# Patient Record
Sex: Male | Born: 1960 | Race: White | Hispanic: No | Smoking: Former smoker
Health system: Southern US, Community
[De-identification: ages and names within clinical notes are randomized; demographics above are authoritative.]

## PROBLEM LIST (undated history)

## (undated) DIAGNOSIS — R931 Abnormal findings on diagnostic imaging of heart and coronary circulation: Secondary | ICD-10-CM

## (undated) DIAGNOSIS — U071 COVID-19: Secondary | ICD-10-CM

## (undated) DIAGNOSIS — R911 Solitary pulmonary nodule: Secondary | ICD-10-CM

## (undated) DIAGNOSIS — Z87891 Personal history of nicotine dependence: Secondary | ICD-10-CM

## (undated) DIAGNOSIS — I2510 Atherosclerotic heart disease of native coronary artery without angina pectoris: Secondary | ICD-10-CM

## (undated) DIAGNOSIS — I700 Atherosclerosis of aorta: Secondary | ICD-10-CM

## (undated) DIAGNOSIS — E785 Hyperlipidemia, unspecified: Secondary | ICD-10-CM

## (undated) DIAGNOSIS — T7840XA Allergy, unspecified, initial encounter: Secondary | ICD-10-CM

## (undated) DIAGNOSIS — Z8249 Family history of ischemic heart disease and other diseases of the circulatory system: Secondary | ICD-10-CM

## (undated) DIAGNOSIS — M7700 Medial epicondylitis, unspecified elbow: Secondary | ICD-10-CM

## (undated) DIAGNOSIS — I10 Essential (primary) hypertension: Secondary | ICD-10-CM

## (undated) HISTORY — DX: Allergy, unspecified, initial encounter: T78.40XA

## (undated) HISTORY — DX: Atherosclerotic heart disease of native coronary artery without angina pectoris: I25.10

## (undated) HISTORY — DX: Family history of ischemic heart disease and other diseases of the circulatory system: Z82.49

## (undated) HISTORY — DX: Solitary pulmonary nodule: R91.1

## (undated) HISTORY — PX: CORNEAL TRANSPLANT: SHX108

## (undated) HISTORY — DX: Hyperlipidemia, unspecified: E78.5

## (undated) HISTORY — DX: Medial epicondylitis, unspecified elbow: M77.00

## (undated) HISTORY — DX: Abnormal findings on diagnostic imaging of heart and coronary circulation: R93.1

## (undated) HISTORY — DX: Personal history of nicotine dependence: Z87.891

## (undated) HISTORY — PX: APPENDECTOMY: SHX54

## (undated) HISTORY — DX: COVID-19: U07.1

## (undated) HISTORY — DX: Essential (primary) hypertension: I10

## (undated) HISTORY — DX: Atherosclerosis of aorta: I70.0

## (undated) HISTORY — PX: FINGER FRACTURE SURGERY: SHX638

---

## 2004-05-01 ENCOUNTER — Emergency Department (HOSPITAL_COMMUNITY): Admission: EM | Admit: 2004-05-01 | Discharge: 2004-05-01 | Payer: Self-pay | Admitting: Emergency Medicine

## 2004-07-26 ENCOUNTER — Ambulatory Visit (HOSPITAL_BASED_OUTPATIENT_CLINIC_OR_DEPARTMENT_OTHER): Admission: RE | Admit: 2004-07-26 | Discharge: 2004-07-26 | Payer: Self-pay | Admitting: *Deleted

## 2004-07-26 ENCOUNTER — Ambulatory Visit (HOSPITAL_COMMUNITY): Admission: RE | Admit: 2004-07-26 | Discharge: 2004-07-26 | Payer: Self-pay | Admitting: *Deleted

## 2005-08-17 ENCOUNTER — Emergency Department (HOSPITAL_COMMUNITY): Admission: EM | Admit: 2005-08-17 | Discharge: 2005-08-17 | Payer: Self-pay | Admitting: Emergency Medicine

## 2006-02-16 ENCOUNTER — Emergency Department (HOSPITAL_COMMUNITY): Admission: EM | Admit: 2006-02-16 | Discharge: 2006-02-16 | Payer: Self-pay | Admitting: Emergency Medicine

## 2006-04-23 ENCOUNTER — Ambulatory Visit (HOSPITAL_BASED_OUTPATIENT_CLINIC_OR_DEPARTMENT_OTHER): Admission: RE | Admit: 2006-04-23 | Discharge: 2006-04-23 | Payer: Self-pay | Admitting: Urology

## 2006-04-23 ENCOUNTER — Encounter (INDEPENDENT_AMBULATORY_CARE_PROVIDER_SITE_OTHER): Payer: Self-pay | Admitting: *Deleted

## 2006-04-23 ENCOUNTER — Encounter (INDEPENDENT_AMBULATORY_CARE_PROVIDER_SITE_OTHER): Payer: Self-pay | Admitting: Specialist

## 2010-03-12 ENCOUNTER — Emergency Department (HOSPITAL_COMMUNITY): Admission: EM | Admit: 2010-03-12 | Discharge: 2010-03-12 | Payer: Self-pay | Admitting: Emergency Medicine

## 2011-01-26 NOTE — Op Note (Signed)
NAMEHILLEL, Jorge Gibson               ACCOUNT NO.:  [id number]   MEDICAL RECORD NO.:  [id number]          PATIENT TYPE:  AMB   LOCATION:  DSC                          FACILITY:  [facility]   PHYSICIAN:  Tennis Must Meyerdierks, M.D.DATE OF BIRTH:  Jan 18, 1961   DATE OF PROCEDURE:  07/26/2004  DATE OF DISCHARGE:                                 OPERATIVE REPORT   PREOPERATIVE DIAGNOSIS:  Fracture middle phalanx right middle finger.   POSTOPERATIVE DIAGNOSIS:  Fracture middle phalanx right middle finger.   PROCEDURE:  Open reduction and internal fixation middle phalanx right middle  finger.   SURGEON:  Lowell Bouton, M.D.   ANESTHESIA:  General.   OPERATIVE FINDINGS:  The patient had an oblique fracture of the middle  phalanx that was partially healed in the right middle finger.   PROCEDURE:  Under general anesthesia with a tourniquet on the right arm, the  right hand was prepped and draped in the usual fashion.  After  exsanguinating the limb, the tourniquet was inflated to 250 mmHg.  A  longitudinal incision was made over the dorsum of the middle phalanx of the  right middle finger. Sharp dissection was carried through the subcutaneous  tissues and bleeding points were coagulated.  Sharp dissection was carried  longitudinally through the extensor mechanism and the periosteum down to the  bone.  The fracture site was identified with difficulty and an elevator was  used to free up the fracture.  A Freer elevator was used once the fracture  was identified and the fracture site was taken down.  A rongeur was used to  remove any callus that had formed.  After completely freeing up the  fracture, it was reduced and a 4-5 K-wire was placed longitudinally across  the DIP joint holding the middle phalanx fracture in place.  A 3-5 K-wire  was then placed from dorsal to volar across the fracture holding it  temporarily.  X-ray showed good alignment and clinically, the finger was  straight.  The longitudinal K-wire was then removed and the mini-fragment  set using 1.5 mm screws was used to fix the fracture.  A 1.1 drill bit was  used to drill from dorsal to palmar and an 8 mm screw was inserted.  The 3-5  K-wire was then removed and that hole was drilled with a 1.1 mm drill.  An 8  mm screw was then inserted.  X-rays showed good position of the fracture and  there was stability clinically.  The wound was then copiously irrigated with  saline.  The periosteum was closed with 4-0 Vicryl and the extensor  mechanism was repaired with 4-0 Vicryl.  3-0 Prolene subcuticular suture was  used for the skin.  Steri-Strips were applied.  0.5% Marcaine digital block  was performed for pain control.  The patient was placed in a splint with a  dry, sterile dressing.  The tourniquet was released with good circulation of  the hand.  He went to the recovery room awake, stable, in good condition.      EMM/MEDQ  D:  07/26/2004  T:  07/26/2004  Job:  [id number]

## 2011-01-26 NOTE — Op Note (Signed)
NAMEKRISTOF, NADEEM               ACCOUNT NO.:  [id number]   MEDICAL RECORD NO.:  [id number]          PATIENT TYPE:  AMB   LOCATION:  [facility]                         FACILITY:  [facility]   PHYSICIAN:  Martina Sinner, MD DATE OF BIRTH:  06/24/1961   DATE OF PROCEDURE:  04/23/2006  DATE OF DISCHARGE:                                 OPERATIVE REPORT   PREOPERATIVE DIAGNOSIS:  Bladder tumor.   POSTOPERATIVE DIAGNOSIS:  Bladder tumor; mild meatal stenosis.   SURGERY:  Cystoscopy, dilation of urethral of distal urethra and meatus plus  transurethral resection of bladder tumor.   HISTORY:  Mr. Schetter was found to have a small polyp in his bladder on the  left lateral wall when being investigated for microscopic hematuria.  He has  a smoking history.   PROCEDURE NOTE:  The patient was prepped and draped in usual fashion.  I  initially cystoscoped the patient with 21-French Storz ureteroscope.  The  penile bulbar membranes and prostatic urethra were normal.  I took a good  look inside his bladder with a 13 and 70 degrees lens and had a small  papillary tumor on a stalk 1 cm lateral to the left ureteral orifice.  There  was no cystoscopic evidence of carcinoma in situ.  I did a saline bladder  wash and sent it for cytology.   I was not able to insert a 24-French resectoscope because of its bluntness  relative to his mild meatal stenosis.  He was dilated up to 28-French.  The  resectoscope was easily passed under direct cystoscopic guidance in the  bladder.   With the bladder partially full, I resected the small tumor on a stock.  I  took a deep muscle bite for staging.  Cystoscopically, the tumor was  superficial with no invasion.   The base of the resected biopsy was fulgurated.  I gave IV indigo carmine,  and there was efflux of indigo carmine from both ureteral orifices.  The  resection was away from the ureter, but I want to make certain that it was  not involved with the  resection.   Hemostasis was excellent.  The patient's bladder was emptied, and the  patient was sent to recovery room without a Foley catheter.           ______________________________  Martina Sinner, MD  Electronically Signed     SAM/MEDQ  D:  04/23/2006  T:  04/23/2006  Job:  [id number]

## 2015-12-07 ENCOUNTER — Encounter (HOSPITAL_COMMUNITY): Payer: Self-pay | Admitting: *Deleted

## 2015-12-07 ENCOUNTER — Emergency Department (HOSPITAL_COMMUNITY)
Admission: EM | Admit: 2015-12-07 | Discharge: 2015-12-07 | Disposition: A | Discharge status: Home / Self Care | Payer: BLUE CROSS/BLUE SHIELD | Source: Home / Self Care | Attending: Family Medicine | Admitting: Family Medicine

## 2015-12-07 DIAGNOSIS — J111 Influenza due to unidentified influenza virus with other respiratory manifestations: Secondary | ICD-10-CM

## 2015-12-07 DIAGNOSIS — R69 Illness, unspecified: Principal | ICD-10-CM

## 2015-12-07 NOTE — ED Notes (Signed)
Pt reports      Symptoms  Of    Fever       Body  Aches      Chills  X  2  Days        pt  Repports  Some  Dizzy  And  Cough  As  Well      sorethroat  As  Well

## 2015-12-07 NOTE — Discharge Instructions (Signed)
Drink plenty of fluids as discussed, use tylenol or advil for fever and mucinex or delsym for cough. Return or see your doctor if further problems

## 2015-12-07 NOTE — ED Provider Notes (Signed)
CSN: [id number]     Arrival date & time 12/07/15  1325 History   First MD Initiated Contact with Patient 12/07/15 1529     Chief Complaint  Patient presents with  . Fever   (Consider location/radiation/quality/duration/timing/severity/associated sxs/prior Treatment) Patient is a 55 y.o. male presenting with flu symptoms. The history is provided by the patient.  Influenza Presenting symptoms: cough, fever, myalgias and rhinorrhea   Presenting symptoms: no shortness of breath, no sore throat and no vomiting   Severity:  Mild Onset quality:  Sudden Duration:  3 days Progression:  Unchanged Chronicity:  New Relieved by:  None tried Worsened by:  Nothing tried Ineffective treatments:  Rest Associated symptoms: nasal congestion     History reviewed. No pertinent past medical history. No past surgical history on file. History reviewed. No pertinent family history. Social History  Substance Use Topics  . Smoking status: Never Smoker   . Smokeless tobacco: None  . Alcohol Use: No    Review of Systems  Constitutional: Positive for fever and appetite change.  HENT: Positive for congestion and rhinorrhea. Negative for sore throat.   Respiratory: Positive for cough. Negative for shortness of breath and wheezing.   Gastrointestinal: Negative.  Negative for vomiting.  Genitourinary: Negative.   Musculoskeletal: Positive for myalgias.  All other systems reviewed and are negative.   Allergies  Review of patient's allergies indicates not on file.  Home Medications   Prior to Admission medications   Not on File   Meds Ordered and Administered this Visit  Medications - No data to display  BP 151/77 mmHg  Pulse 82  Temp(Src) 100.2 F (37.9 C) (Oral)  Resp 14  SpO2 100% No data found.   Physical Exam  Constitutional: He is oriented to person, place, and time. He appears well-developed and well-nourished. No distress.  HENT:  Right Ear: External ear normal.  Left Ear:  External ear normal.  Mouth/Throat: Oropharynx is clear and moist.  Neck: Normal range of motion. Neck supple.  Cardiovascular: Normal rate, normal heart sounds and intact distal pulses.   Pulmonary/Chest: Effort normal and breath sounds normal.  Abdominal: Soft. Bowel sounds are normal. There is no tenderness. There is no rebound and no guarding.  Neurological: He is alert and oriented to person, place, and time.  Skin: Skin is warm and dry.  Nursing note and vitals reviewed.   ED Course  Procedures (including critical care time)  Labs Review Labs Reviewed - No data to display  Imaging Review No results found.   Visual Acuity Review  Right Eye Distance:   Left Eye Distance:   Bilateral Distance:    Right Eye Near:   Left Eye Near:    Bilateral Near:         MDM   1. Influenza-like illness        Billy Fischer, MD 12/07/15 1650

## 2018-09-10 DIAGNOSIS — U071 COVID-19: Secondary | ICD-10-CM

## 2018-09-10 HISTORY — DX: COVID-19: U07.1

## 2019-08-14 ENCOUNTER — Other Ambulatory Visit: Payer: Self-pay

## 2019-08-14 ENCOUNTER — Inpatient Hospital Stay (HOSPITAL_COMMUNITY)
Admission: EM | Admit: 2019-08-14 | Discharge: 2019-08-19 | DRG: 177 | Disposition: A | Discharge status: Home / Self Care | Payer: BC Managed Care – PPO | Attending: Internal Medicine | Admitting: Internal Medicine

## 2019-08-14 ENCOUNTER — Emergency Department (HOSPITAL_COMMUNITY): Payer: BC Managed Care – PPO

## 2019-08-14 ENCOUNTER — Encounter (HOSPITAL_COMMUNITY): Payer: Self-pay

## 2019-08-14 DIAGNOSIS — J1289 Other viral pneumonia: Secondary | ICD-10-CM | POA: Diagnosis present

## 2019-08-14 DIAGNOSIS — E871 Hypo-osmolality and hyponatremia: Secondary | ICD-10-CM | POA: Diagnosis present

## 2019-08-14 DIAGNOSIS — R0902 Hypoxemia: Secondary | ICD-10-CM | POA: Diagnosis present

## 2019-08-14 DIAGNOSIS — U071 COVID-19: Principal | ICD-10-CM | POA: Diagnosis present

## 2019-08-14 DIAGNOSIS — J189 Pneumonia, unspecified organism: Secondary | ICD-10-CM | POA: Diagnosis not present

## 2019-08-14 DIAGNOSIS — E663 Overweight: Secondary | ICD-10-CM | POA: Diagnosis present

## 2019-08-14 DIAGNOSIS — E860 Dehydration: Secondary | ICD-10-CM | POA: Diagnosis present

## 2019-08-14 DIAGNOSIS — R439 Unspecified disturbances of smell and taste: Secondary | ICD-10-CM | POA: Diagnosis present

## 2019-08-14 DIAGNOSIS — Z20822 Contact with and (suspected) exposure to covid-19: Secondary | ICD-10-CM

## 2019-08-14 DIAGNOSIS — Z6827 Body mass index (BMI) 27.0-27.9, adult: Secondary | ICD-10-CM | POA: Diagnosis not present

## 2019-08-14 DIAGNOSIS — J1281 Pneumonia due to SARS-associated coronavirus: Secondary | ICD-10-CM | POA: Diagnosis not present

## 2019-08-14 DIAGNOSIS — R0602 Shortness of breath: Secondary | ICD-10-CM | POA: Diagnosis present

## 2019-08-14 LAB — COMPREHENSIVE METABOLIC PANEL
ALT: 26 U/L (ref 0–44)
AST: 22 U/L (ref 15–41)
Albumin: 3.8 g/dL (ref 3.5–5.0)
Alkaline Phosphatase: 63 U/L (ref 38–126)
Anion gap: 11 (ref 5–15)
BUN: 14 mg/dL (ref 6–20)
CO2: 26 mmol/L (ref 22–32)
Calcium: 8.2 mg/dL — ABNORMAL LOW (ref 8.9–10.3)
Chloride: 95 mmol/L — ABNORMAL LOW (ref 98–111)
Creatinine, Ser: 0.82 mg/dL (ref 0.61–1.24)
GFR calc Af Amer: >60 mL/min (ref >60)
GFR calc non Af Amer: >60 mL/min (ref >60)
Glucose, Bld: 101 mg/dL — ABNORMAL HIGH (ref 70–99)
Potassium: 3.6 mmol/L (ref 3.5–5.1)
Sodium: 132 mmol/L — ABNORMAL LOW (ref 135–145)
Total Bilirubin: 1.2 mg/dL (ref 0.3–1.2)
Total Protein: 7.1 g/dL (ref 6.5–8.1)

## 2019-08-14 LAB — CBC WITH DIFFERENTIAL/PLATELET
Abs Immature Granulocytes: 0.04 10*3/uL (ref 0.00–0.07)
Basophils Absolute: 0 10*3/uL (ref 0.0–0.1)
Basophils Relative: 1 %
Eosinophils Absolute: 0 10*3/uL (ref 0.0–0.5)
Eosinophils Relative: 0 %
HCT: 40 % (ref 39.0–52.0)
Hemoglobin: 13.6 g/dL (ref 13.0–17.0)
Immature Granulocytes: 1 %
Lymphocytes Relative: 24 %
Lymphs Abs: 1.2 10*3/uL (ref 0.7–4.0)
MCH: 30.4 pg (ref 26.0–34.0)
MCHC: 34 g/dL (ref 30.0–36.0)
MCV: 89.3 fL (ref 80.0–100.0)
Monocytes Absolute: 0.6 10*3/uL (ref 0.1–1.0)
Monocytes Relative: 11 %
Neutro Abs: 3.1 10*3/uL (ref 1.7–7.7)
Neutrophils Relative %: 63 %
Platelets: 152 10*3/uL (ref 150–400)
RBC: 4.48 MIL/uL (ref 4.22–5.81)
RDW: 13.1 % (ref 11.5–15.5)
WBC: 4.9 10*3/uL (ref 4.0–10.5)
nRBC: 0 % (ref 0.0–0.2)

## 2019-08-14 LAB — TROPONIN I (HIGH SENSITIVITY)
Troponin I (High Sensitivity): 5 ng/L (ref <18)
Troponin I (High Sensitivity): 3 ng/L (ref <18)

## 2019-08-14 LAB — LACTIC ACID, PLASMA
Lactic Acid, Venous: 0.9 mmol/L (ref 0.5–1.9)
Lactic Acid, Venous: 0.9 mmol/L (ref 0.5–1.9)

## 2019-08-14 MED ORDER — SODIUM CHLORIDE 0.9 % IV SOLN: 1.0000 g | INTRAVENOUS | Status: DC

## 2019-08-14 MED ORDER — ACETAMINOPHEN 500 MG PO TABS
1000.0000 mg | ORAL_TABLET | Freq: Once | ORAL | Status: AC
  Administered 2019-08-15: 1000 mg via ORAL
  Filled 2019-08-14: qty 2

## 2019-08-14 MED ORDER — ACETAMINOPHEN 500 MG PO TABS
1000.0000 mg | ORAL_TABLET | Freq: Once | ORAL | Status: AC
  Administered 2019-08-14: 18:00:00 1000 mg via ORAL
  Filled 2019-08-14: qty 2

## 2019-08-14 MED ORDER — SODIUM CHLORIDE 0.9 % IV SOLN
500.0000 mg | Freq: Once | INTRAVENOUS | Status: AC
  Administered 2019-08-14: 500 mg via INTRAVENOUS
  Filled 2019-08-14: qty 500

## 2019-08-14 MED ORDER — SODIUM CHLORIDE 0.9 % IV SOLN: 500.0000 mg | Freq: Every day | INTRAVENOUS | Status: DC

## 2019-08-14 MED ORDER — SODIUM CHLORIDE 0.9 % IV SOLN
1.0000 g | Freq: Once | INTRAVENOUS | Status: AC
  Administered 2019-08-14: 20:00:00 1 g via INTRAVENOUS
  Filled 2019-08-14: qty 10

## 2019-08-14 NOTE — H&P (Signed)
History and Physical    Jorge Gibson [id number] DOB: 03-Jan-1961 DOA: 08/14/2019  PCP:  I have personally briefly reviewed the data and note from the emergency department  Chief Complaint: Worsening shortness of breath and low-grade fever  HPI: Jorge Gibson is a 58 y.o. male with history of no major illnesses.  He has not seen a physician in several years.  He has been getting more short of breath minimal cough symptoms started 5 days ago he had increasing chest congestion low-grade fever and generalized weakness.  He has been exposed to Covid positive patients.  He is Covid test is pending.  A swab had earlier been taking and [facility] repeat swab from here is pending also.  He kept feeling worse and came to the emergency room ED Course: Reviewed patient has been given IV fluids and Zithromax he has been feeling somewhat better.  Review of Systems: As per HPI otherwise 10 point review of systems negative.   History reviewed. No pertinent past medical history. He denies hypertension diabetes any respiratory or GI issues` ` History reviewed. No pertinent surgical history.   reports that he has never smoked. He has never used smokeless tobacco. He reports current alcohol use. No history on file for drug.  No Known Allergies  No family history on file.  Prior to Admission medications   Medication Sig Start Date End Date Taking? Authorizing Provider  acetaminophen (TYLENOL) 325 MG tablet Take 650 mg by mouth every 6 (six) hours as needed for mild pain or headache.   Yes [provider]    Physical Exam: Vitals:   08/14/19 2000 08/14/19 2018 08/14/19 2030 08/14/19 2100  BP: 136/70  121/65 117/63  Pulse: 75  70 72  Resp: (!) 26  (!) 21 (!) 22  Temp:  99.1 F (37.3 C)    TempSrc:  Oral    SpO2: 95%  94% 94%  Weight:      Height:        Constitutional: NAD, calm, comfortable Vitals:   08/14/19 2000 08/14/19 2018 08/14/19 2030 08/14/19 2100  BP: 136/70   121/65 117/63  Pulse: 75  70 72  Resp: (!) 26  (!) 21 (!) 22  Temp:  99.1 F (37.3 C)    TempSrc:  Oral    SpO2: 95%  94% 94%  Weight:      Height:       Eyes: PERRLA.  Conjunctivae normal ENT: No exudate no tenderness.  Mucous membranes are dry. Skin: Turgor is poor Neck: Supple no thyromegaly no lymph nodes. No venous distention noted Chest:  Non tender Lungs: Diminished breath sounds in the lower lung fields no wheezing Rales or egophony elicited. Abdomen: Is nontender with good bowel sounds no significant tenderness or guarding noted, Heart: Regular rhythm.  No S3-4 or S3 noted no murmurs. Extremities: No edema cyanosis or clubbing. Pulses: 1+ bilateral pedal pulses. Neurologic: No focal motor or sensory deficits noted.  He is alert oriented insight and says all questions appropriately. Psychiatric: Appears to be calm.  No indicator of depression. Mood and affect are normal  (Labs on Admission: I have personally reviewed following labs and imaging studies: WBC is normal hemoglobin is normal.  Sodium is somewhat low at 132 blood sugar is marginal at 101.  Kidney and liver function are preserved.  Calcium is 8 point  CBC: Recent Labs  Lab 08/14/19 1818  WBC 4.9  NEUTROABS 3.1  HGB 13.6  HCT 40.0  MCV  89.3  PLT 0000000   Basic Metabolic Panel: Recent Labs  Lab 08/14/19 1818  NA 132*  K 3.6  CL 95*  CO2 26  GLUCOSE 101*  BUN 14  CREATININE 0.82  CALCIUM 8.2*   GFR: Estimated Creatinine Clearance: 111.8 mL/min (by C-G formula based on SCr of 0.82 mg/dL). Liver Function Tests: Recent Labs  Lab 08/14/19 1818  AST 22  ALT 26  ALKPHOS 63  BILITOT 1.2  PROT 7.1  ALBUMIN 3.8   No results for input(s): LIPASE, AMYLASE in the last 168 hours. No results for input(s): AMMONIA in the last 168 hours. Coagulation Profile: No results for input(s): INR, PROTIME in the last 168 hours. Cardiac Enzymes: No results for input(s): CKTOTAL, CKMB, CKMBINDEX, TROPONINI in the  last 168 hours. BNP (last 3 results) No results for input(s): PROBNP in the last 8760 hours. HbA1C: No results for input(s): HGBA1C in the last 72 hours. CBG: No results for input(s): GLUCAP in the last 168 hours. Lipid Profile: No results for input(s): CHOL, HDL, LDLCALC, TRIG, CHOLHDL, LDLDIRECT in the last 72 hours. Thyroid Function Tests: No results for input(s): TSH, T4TOTAL, FREET4, T3FREE, THYROIDAB in the last 72 hours. Anemia Panel: No results for input(s): VITAMINB12, FOLATE, FERRITIN, TIBC, IRON, RETICCTPCT in the last 72 hours. Urine analysis: No results found for: COLORURINE, APPEARANCEUR, LABSPEC, [place], GLUCOSEU, HGBUR, BILIRUBINUR, KETONESUR, PROTEINUR, UROBILINOGEN, NITRITE, LEUKOCYTESUR  Radiological Exams on Admission: Dg Chest Port 1 View  Result Date: 08/14/2019 CLINICAL DATA:  Cough and fever. EXAM: PORTABLE CHEST 1 VIEW COMPARISON:  None FINDINGS: Normal heart size. No pleural effusion identified. Asymmetric airspace consolidation in the left midlung and left base identified compatible with pneumonia. Right lung clear. Visualized osseous structures appear intact. IMPRESSION: 1. Left midlung and left lower lobe pneumonia. 2. Recommend followup imaging to ensure resolution. Electronically Signed   By: Kerby Moors M.D.   On: 08/14/2019 19:29    EKG: Independently reviewed.   Assessment/Plan #1 person under investigation.  Covid screening pending/will treat as infected till proven otherwise #2 left lower lobe pneumonia: [place] community-acquired/treat with Zithromax dose has been given.  Add Rocephin to the regimen #3 hyponatremia with mild clinical dehydration/hydration and follow kidney and liver function   DVT prophylaxis: Lovenox Code Status: Full Family Communication: No family available Disposition Plan: Home self-care Consults called: None Admission status: Full  Time spent: 55 minutes  Pearlean Brownie MD [facility] Pager [phone]   If  7PM-7AM, please contact night-coverage [email or web address] Password [facility]  08/14/2019, 9:36 PM

## 2019-08-14 NOTE — ED Triage Notes (Signed)
Pt states starting Thursday he has experienced SHOB, fever, productive cough, headache, loss of taste and smell, chest pressure and chills. Pt was tested today for COVID at [facility] awaiting results.

## 2019-08-14 NOTE — ED Provider Notes (Signed)
[facility] DEPT Provider Note   CSN: [id number] Arrival date & time: 08/14/19  1655     History   Chief Complaint Chief Complaint  Patient presents with  . Shortness of Breath  . Chest Pain  . Cough    HPI DINK TILTON is a 58 y.o. male.     58 year old male with prior medical history as detailed below presents for evaluation of cough, chest congestion, fever, and weakness.  Patient reports symptoms started approximately 5 days prior.  He reports fevers at home.  He denies known exposure to Covid positive patients.  He reportedly obtain an outpatient Covid test earlier today at [facility]  He felt worse this evening and decided to come to the ED for evaluation.  He does not have routine primary medical care.  The history is provided by the patient and medical records.  Shortness of Breath Severity:  Mild Onset quality:  Gradual Duration:  5 days Timing:  Constant Progression:  Worsening Chronicity:  New Relieved by:  Nothing Worsened by:  Nothing Associated symptoms: chest pain, cough and fever   Chest Pain Associated symptoms: cough, fever and shortness of breath   Cough Associated symptoms: chest pain, fever and shortness of breath     History reviewed. No pertinent past medical history.  There are no active problems to display for this patient.   History reviewed. No pertinent surgical history.      Home Medications    Prior to Admission medications   Not on File    Family History No family history on file.  Social History Social History   Tobacco Use  . Smoking status: Never Smoker  . Smokeless tobacco: Never Used  Substance Use Topics  . Alcohol use: Yes    Comment: social  . Drug use: Not on file     Allergies   Patient has no known allergies.   Review of Systems Review of Systems  Constitutional: Positive for fever.  Respiratory: Positive for cough and shortness of breath.   Cardiovascular:  Positive for chest pain.  All other systems reviewed and are negative.    Physical Exam Updated Vital Signs BP 118/62   Pulse 73   Temp 100.2 F (37.9 C) (Oral)   Resp (!) 22   Ht 5\' 11"  (1.803 m)   Wt 90.7 kg   SpO2 92%   BMI 27.89 kg/m   Physical Exam Vitals signs and nursing note reviewed.  Constitutional:      General: He is not in acute distress.    Appearance: He is well-developed.  HENT:     Head: Normocephalic and atraumatic.  Eyes:     Conjunctiva/sclera: Conjunctivae normal.     Pupils: Pupils are equal, round, and reactive to light.  Neck:     Musculoskeletal: Normal range of motion and neck supple.  Cardiovascular:     Rate and Rhythm: Normal rate and regular rhythm.     Heart sounds: Normal heart sounds.  Pulmonary:     Effort: Pulmonary effort is normal. No respiratory distress.     Breath sounds: Normal breath sounds.  Abdominal:     General: There is no distension.     Palpations: Abdomen is soft.     Tenderness: There is no abdominal tenderness.  Musculoskeletal: Normal range of motion.        General: No deformity.  Skin:    General: Skin is warm and dry.  Neurological:     Mental  Status: He is alert and oriented to person, place, and time.      ED Treatments / Results  Labs (all labs ordered are listed, but only abnormal results are displayed) Labs Reviewed  COMPREHENSIVE METABOLIC PANEL - Abnormal; Notable for the following components:      Result Value   Sodium 132 (*)    Chloride 95 (*)    Glucose, Bld 101 (*)    Calcium 8.2 (*)    All other components within normal limits  SARS CORONAVIRUS 2 (TAT 6-24 HRS)  CULTURE, BLOOD (ROUTINE X 2)  CULTURE, BLOOD (ROUTINE X 2)  CBC WITH DIFFERENTIAL/PLATELET  LACTIC ACID, PLASMA  LACTIC ACID, PLASMA  TROPONIN I (HIGH SENSITIVITY)  TROPONIN I (HIGH SENSITIVITY)    EKG EKG Interpretation  Date/Time:  Friday August 14 2019 17:37:49 EST Ventricular Rate:  82 PR Interval:    QRS  Duration: 104 QT Interval:  367 QTC Calculation: 429 R Axis:   -9 Text Interpretation: Sinus rhythm Confirmed by Dene Gentry [phone]) on 08/14/2019 5:55:05 PM   Radiology Dg Chest Port 1 View  Result Date: 08/14/2019 CLINICAL DATA:  Cough and fever. EXAM: PORTABLE CHEST 1 VIEW COMPARISON:  None FINDINGS: Normal heart size. No pleural effusion identified. Asymmetric airspace consolidation in the left midlung and left base identified compatible with pneumonia. Right lung clear. Visualized osseous structures appear intact. IMPRESSION: 1. Left midlung and left lower lobe pneumonia. 2. Recommend followup imaging to ensure resolution. Electronically Signed   By: Kerby Moors M.D.   On: 08/14/2019 19:29    Procedures Procedures (including critical care time)  Medications Ordered in ED Medications  cefTRIAXone (ROCEPHIN) 1 g in sodium chloride 0.9 % 100 mL IVPB (has no administration in time range)  azithromycin (ZITHROMAX) 500 mg in sodium chloride 0.9 % 250 mL IVPB (has no administration in time range)  acetaminophen (TYLENOL) tablet 1,000 mg (1,000 mg Oral Given 08/14/19 1803)     Initial Impression / Assessment and Plan / ED Course  I have reviewed the triage vital signs and the nursing notes.  Pertinent labs & imaging results that were available during my care of the patient were reviewed by me and considered in my medical decision making (see chart for details).        MDM  Screen complete  LEVESTER RISTIC was evaluated in Emergency Department on 08/14/2019 for the symptoms described in the history of present illness. He was evaluated in the context of the global COVID-19 pandemic, which necessitated consideration that the patient might be at risk for infection with the SARS-CoV-2 virus that causes COVID-19. Institutional protocols and algorithms that pertain to the evaluation of patients at risk for COVID-19 are in a state of rapid change based on information released by  regulatory bodies including the CDC and federal and state organizations. These policies and algorithms were followed during the patient's care in the ED.  Patient is presenting for evaluation of symptoms suggestive of pulmonary infection.  Chest x-ray suggest left mid and lower lung infiltrate.   Patient will be treated for community-acquired pneumonia.  Covid test is pending at the time of admission.  Hospitalist service is aware of case and will evaluate for admission.  Final Clinical Impressions(s) / ED Diagnoses   Final diagnoses:  Community acquired pneumonia of left lower lobe of lung    ED Discharge Orders    None       Valarie Merino, MD 08/14/19 2004

## 2019-08-15 DIAGNOSIS — J1281 Pneumonia due to SARS-associated coronavirus: Secondary | ICD-10-CM

## 2019-08-15 DIAGNOSIS — E663 Overweight: Secondary | ICD-10-CM

## 2019-08-15 LAB — CBC
HCT: 38.6 % — ABNORMAL LOW (ref 39.0–52.0)
Hemoglobin: 12.9 g/dL — ABNORMAL LOW (ref 13.0–17.0)
MCH: 29.7 pg (ref 26.0–34.0)
MCHC: 33.4 g/dL (ref 30.0–36.0)
MCV: 88.9 fL (ref 80.0–100.0)
Platelets: 151 10*3/uL (ref 150–400)
RBC: 4.34 MIL/uL (ref 4.22–5.81)
RDW: 13.1 % (ref 11.5–15.5)
WBC: 4.5 10*3/uL (ref 4.0–10.5)
nRBC: 0 % (ref 0.0–0.2)

## 2019-08-15 LAB — HIV ANTIBODY (ROUTINE TESTING W REFLEX): HIV Screen 4th Generation wRfx: NONREACTIVE

## 2019-08-15 LAB — C-REACTIVE PROTEIN: CRP: 10 mg/dL — ABNORMAL HIGH (ref <1.0)

## 2019-08-15 LAB — COMPREHENSIVE METABOLIC PANEL
ALT: 22 U/L (ref 0–44)
AST: 21 U/L (ref 15–41)
Albumin: 3.3 g/dL — ABNORMAL LOW (ref 3.5–5.0)
Alkaline Phosphatase: 62 U/L (ref 38–126)
Anion gap: 12 (ref 5–15)
BUN: 15 mg/dL (ref 6–20)
CO2: 25 mmol/L (ref 22–32)
Calcium: 8.3 mg/dL — ABNORMAL LOW (ref 8.9–10.3)
Chloride: 99 mmol/L (ref 98–111)
Creatinine, Ser: 0.65 mg/dL (ref 0.61–1.24)
GFR calc Af Amer: >60 mL/min (ref >60)
GFR calc non Af Amer: >60 mL/min (ref >60)
Glucose, Bld: 103 mg/dL — ABNORMAL HIGH (ref 70–99)
Potassium: 3.5 mmol/L (ref 3.5–5.1)
Sodium: 136 mmol/L (ref 135–145)
Total Bilirubin: 0.8 mg/dL (ref 0.3–1.2)
Total Protein: 6.7 g/dL (ref 6.5–8.1)

## 2019-08-15 LAB — SARS CORONAVIRUS 2 (TAT 6-24 HRS): SARS Coronavirus 2: POSITIVE — AB

## 2019-08-15 LAB — GLUCOSE, CAPILLARY
Glucose-Capillary: 160 mg/dL — ABNORMAL HIGH (ref 70–99)
Glucose-Capillary: 115 mg/dL — ABNORMAL HIGH (ref 70–99)

## 2019-08-15 LAB — CREATININE, SERUM
Creatinine, Ser: 0.74 mg/dL (ref 0.61–1.24)
GFR calc Af Amer: >60 mL/min (ref >60)
GFR calc non Af Amer: >60 mL/min (ref >60)

## 2019-08-15 LAB — D-DIMER, QUANTITATIVE: D-Dimer, Quant: 0.52 ug/mL-FEU — ABNORMAL HIGH (ref 0.00–0.50)

## 2019-08-15 LAB — NOVEL CORONAVIRUS, NAA: SARS-CoV-2, NAA: DETECTED — AB

## 2019-08-15 LAB — BRAIN NATRIURETIC PEPTIDE: B Natriuretic Peptide: 16.1 pg/mL (ref 0.0–100.0)

## 2019-08-15 LAB — TYPE AND SCREEN
ABO/RH(D): A POS
Antibody Screen: NEGATIVE

## 2019-08-15 MED ORDER — SODIUM CHLORIDE 0.9 % IV SOLN
100.0000 mg | Freq: Every day | INTRAVENOUS | Status: AC
  Administered 2019-08-16 – 2019-08-19 (×4): 100 mg via INTRAVENOUS
  Filled 2019-08-15 (×3): qty 20
  Filled 2019-08-15 (×2): qty 100

## 2019-08-15 MED ORDER — FOLIC ACID 1 MG PO TABS
1.0000 mg | ORAL_TABLET | Freq: Every day | ORAL | Status: DC
  Administered 2019-08-15 – 2019-08-19 (×5): 1 mg via ORAL
  Filled 2019-08-15 (×5): qty 1

## 2019-08-15 MED ORDER — INSULIN ASPART 100 UNIT/ML ~~LOC~~ SOLN
0.0000 [IU] | SUBCUTANEOUS | Status: DC
  Administered 2019-08-15: 3 [IU] via SUBCUTANEOUS

## 2019-08-15 MED ORDER — GUAIFENESIN-DM 100-10 MG/5ML PO SYRP
10.0000 mL | ORAL_SOLUTION | ORAL | Status: DC | PRN
  Administered 2019-08-15 – 2019-08-17 (×5): 10 mL via ORAL
  Filled 2019-08-15 (×5): qty 10

## 2019-08-15 MED ORDER — VITAMIN B-1 100 MG PO TABS
100.0000 mg | ORAL_TABLET | Freq: Every day | ORAL | Status: DC
  Administered 2019-08-15 – 2019-08-19 (×5): 100 mg via ORAL
  Filled 2019-08-15 (×5): qty 1

## 2019-08-15 MED ORDER — ENOXAPARIN SODIUM 40 MG/0.4ML ~~LOC~~ SOLN
40.0000 mg | Freq: Every day | SUBCUTANEOUS | Status: DC
  Administered 2019-08-15: 40 mg via SUBCUTANEOUS
  Filled 2019-08-15: qty 0.4

## 2019-08-15 MED ORDER — SODIUM CHLORIDE 0.9 % IV SOLN
200.0000 mg | Freq: Once | INTRAVENOUS | Status: AC
  Administered 2019-08-15: 200 mg via INTRAVENOUS
  Filled 2019-08-15: qty 200

## 2019-08-15 MED ORDER — DEXAMETHASONE SODIUM PHOSPHATE 10 MG/ML IJ SOLN
6.0000 mg | INTRAMUSCULAR | Status: DC
  Administered 2019-08-15: 6 mg via INTRAVENOUS
  Filled 2019-08-15: qty 1

## 2019-08-15 MED ORDER — ALBUTEROL SULFATE HFA 108 (90 BASE) MCG/ACT IN AERS
2.0000 | INHALATION_SPRAY | Freq: Four times a day (QID) | RESPIRATORY_TRACT | Status: DC | PRN
  Administered 2019-08-18: 2 via RESPIRATORY_TRACT
  Filled 2019-08-15: qty 6.7

## 2019-08-15 MED ORDER — DEXAMETHASONE SODIUM PHOSPHATE 10 MG/ML IJ SOLN
6.0000 mg | Freq: Two times a day (BID) | INTRAMUSCULAR | Status: DC
  Administered 2019-08-15 – 2019-08-17 (×4): 6 mg via INTRAVENOUS
  Filled 2019-08-15 (×4): qty 1

## 2019-08-15 MED ORDER — SODIUM CHLORIDE 0.9 % IV SOLN: 1.0000 g | INTRAVENOUS | Status: DC

## 2019-08-15 NOTE — ED Notes (Signed)
[facility] arrived  

## 2019-08-15 NOTE — Plan of Care (Signed)
  Problem: Respiratory: °Goal: Will maintain a patent airway °Outcome: Progressing °  °Problem: Respiratory: °Goal: Complications related to the disease process, condition or treatment will be avoided or minimized °Outcome: Progressing °  °Problem: Education: °Goal: Knowledge of risk factors and measures for prevention of condition will improve °Outcome: Progressing °  °

## 2019-08-15 NOTE — ED Notes (Signed)
ED TO INPATIENT HANDOFF REPORT  Name/Age/Gender Jorge Gibson 58 y.o. male  Code Status    Code Status Orders  (From admission, onward)         Start     Ordered   08/15/19 0007  Full code  Continuous     08/15/19 0007        Code Status History    This patient has a current code status but no historical code status.   Advance Care Planning Activity      Home/SNF/Other Home  Chief Complaint chest pain; shob; congestion  Level of Care/Admitting Diagnosis ED Disposition    ED Disposition Condition [facility] Area: [place] [[id number]]  Level of Care: Med-Surg [16]  Covid Evaluation: Confirmed COVID Positive  Diagnosis: CAP (community acquired [id number]  Admitting Physician: Pearlean Brownie [id number]  Attending Physician: HAQ, AHMAD T [id number]  Estimated length of stay: past midnight tomorrow  Certification:: I certify this patient will need inpatient services for at least 2 midnights  PT Class (Do Not Modify): Inpatient [101]  PT Acc Code (Do Not Modify): Private [1]       Medical History History reviewed. No pertinent past medical history.  Allergies No Known Allergies  IV Location/Drains/Wounds Patient Lines/Drains/Airways Status   Active Line/Drains/Airways    Name:   Placement date:   Placement time:   Site:   Days:   Peripheral IV 08/14/19 Left Antecubital   08/14/19    2000    Antecubital   1          Labs/Imaging Results for orders placed or performed during the hospital encounter of 08/14/19 (from the past 48 hour(s))  Comprehensive metabolic panel     Status: Abnormal   Collection Time: 08/14/19  6:18 PM  Result Value Ref Range   Sodium 132 (L) 135 - 145 mmol/L   Potassium 3.6 3.5 - 5.1 mmol/L   Chloride 95 (L) 98 - 111 mmol/L   CO2 26 22 - 32 mmol/L   Glucose, Bld 101 (H) 70 - 99 mg/dL   BUN 14 6 - 20 mg/dL   Creatinine, Ser 0.82 0.61 - 1.24 mg/dL   Calcium 8.2 (L) 8.9 - 10.3 mg/dL    Total Protein 7.1 6.5 - 8.1 g/dL   Albumin 3.8 3.5 - 5.0 g/dL   AST 22 15 - 41 U/L   ALT 26 0 - 44 U/L   Alkaline Phosphatase 63 38 - 126 U/L   Total Bilirubin 1.2 0.3 - 1.2 mg/dL   GFR calc non Af Amer >60 >60 mL/min   GFR calc Af Amer >60 >60 mL/min   Anion gap 11 5 - 15    Comment: Performed at [facility], [place] [address] [place], [place] [address]  CBC with Differential     Status: None   Collection Time: 08/14/19  6:18 PM  Result Value Ref Range   WBC 4.9 4.0 - 10.5 K/uL   RBC 4.48 4.22 - 5.81 MIL/uL   Hemoglobin 13.6 13.0 - 17.0 g/dL   HCT 40.0 39.0 - 52.0 %   MCV 89.3 80.0 - 100.0 fL   MCH 30.4 26.0 - 34.0 pg   MCHC 34.0 30.0 - 36.0 g/dL   RDW 13.1 11.5 - 15.5 %   Platelets 152 150 - 400 K/uL   nRBC 0.0 0.0 - 0.2 %   Neutrophils Relative % 63 %   Neutro Abs 3.1 1.7 -  7.7 K/uL   Lymphocytes Relative 24 %   Lymphs Abs 1.2 0.7 - 4.0 K/uL   Monocytes Relative 11 %   Monocytes Absolute 0.6 0.1 - 1.0 K/uL   Eosinophils Relative 0 %   Eosinophils Absolute 0.0 0.0 - 0.5 K/uL   Basophils Relative 1 %   Basophils Absolute 0.0 0.0 - 0.1 K/uL   Immature Granulocytes 1 %   Abs Immature Granulocytes 0.04 0.00 - 0.07 K/uL    Comment: Performed at [facility], [place] [address], [place], [place] [address]  SARS CORONAVIRUS 2 (TAT 6-24 HRS) Nasopharyngeal Nasopharyngeal Swab     Status: Abnormal   Collection Time: 08/14/19  6:18 PM   Specimen: Nasopharyngeal Swab  Result Value Ref Range   SARS Coronavirus 2 POSITIVE (A) NEGATIVE    Comment: RESULT CALLED TO, READ BACK BY AND VERIFIED WITH: Haze Justin [id number] 08/15/2019 T. TYSOR (NOTE) SARS-CoV-2 target nucleic acids are DETECTED. The SARS-CoV-2 RNA is generally detectable in upper and lower respiratory specimens during the acute phase of infection. Positive results are indicative of the presence of SARS-CoV-2 RNA. Clinical correlation with patient history and other diagnostic information  is  necessary to determine patient infection status. Positive results do not rule out bacterial infection or co-infection with other viruses.  The expected result is Negative. Fact Sheet for Patients: SugarRoll.be Fact Sheet for Healthcare Providers: https://www.woods-mathews.com/ This test is not yet approved or cleared by the [place] FDA and  has been authorized for detection and/or diagnosis of SARS-CoV-2 by FDA under an Emergency Use Authorization (EUA). This EUA will remain  in effect (meaning this test can be used) f or the duration of the COVID-19 declaration under Section 564(b)(1) of the Act, 21 U.S.C. section 360bbb-3(b)(1), unless the authorization is terminated or revoked sooner. Performed at [facility], [place] [address], [place], [place] [address]   Troponin I (High Sensitivity)     Status: None   Collection Time: 08/14/19  6:18 PM  Result Value Ref Range   Troponin I (High Sensitivity) 5 <18 ng/L    Comment: (NOTE) Elevated high sensitivity troponin I (hsTnI) values and significant  changes across serial measurements may suggest ACS but many other  chronic and acute conditions are known to elevate hsTnI results.  Refer to the "Links" section for chest pain algorithms and additional  guidance. Performed at [facility], [place] [address], [place], [place] [address]   Lactic acid, plasma     Status: None   Collection Time: 08/14/19  6:18 PM  Result Value Ref Range   Lactic Acid, Venous 0.9 0.5 - 1.9 mmol/L    Comment: Performed at [facility], [place] [address] [place], [place] [address]  Lactic acid, plasma     Status: None   Collection Time: 08/14/19  7:52 PM  Result Value Ref Range   Lactic Acid, Venous 0.9 0.5 - 1.9 mmol/L    Comment: Performed at [facility], [place] [address] [place], [place] [address]  Troponin I (High Sensitivity)     Status: None    Collection Time: 08/14/19  7:52 PM  Result Value Ref Range   Troponin I (High Sensitivity) 3 <18 ng/L    Comment: (NOTE) Elevated high sensitivity troponin I (hsTnI) values and significant  changes across serial measurements may suggest ACS but many other  chronic and acute conditions are known to elevate hsTnI results.  Refer to the "Links" section for chest pain  algorithms and additional  guidance. Performed at [facility], [place] [address], [place], [place] [address]   CBC     Status: Abnormal   Collection Time: 08/15/19  2:42 AM  Result Value Ref Range   WBC 4.5 4.0 - 10.5 K/uL   RBC 4.34 4.22 - 5.81 MIL/uL   Hemoglobin 12.9 (L) 13.0 - 17.0 g/dL   HCT 38.6 (L) 39.0 - 52.0 %   MCV 88.9 80.0 - 100.0 fL   MCH 29.7 26.0 - 34.0 pg   MCHC 33.4 30.0 - 36.0 g/dL   RDW 13.1 11.5 - 15.5 %   Platelets 151 150 - 400 K/uL   nRBC 0.0 0.0 - 0.2 %    Comment: Performed at [facility], [place] [address] [place], [place] [address]  Creatinine, serum     Status: None   Collection Time: 08/15/19  2:42 AM  Result Value Ref Range   Creatinine, Ser 0.74 0.61 - 1.24 mg/dL   GFR calc non Af Amer >60 >60 mL/min   GFR calc Af Amer >60 >60 mL/min    Comment: Performed at [facility], [place] [address], [place], [place] [address]  Comprehensive metabolic panel     Status: Abnormal   Collection Time: 08/15/19  5:51 AM  Result Value Ref Range   Sodium 136 135 - 145 mmol/L   Potassium 3.5 3.5 - 5.1 mmol/L   Chloride 99 98 - 111 mmol/L   CO2 25 22 - 32 mmol/L   Glucose, Bld 103 (H) 70 - 99 mg/dL   BUN 15 6 - 20 mg/dL   Creatinine, Ser 0.65 0.61 - 1.24 mg/dL   Calcium 8.3 (L) 8.9 - 10.3 mg/dL   Total Protein 6.7 6.5 - 8.1 g/dL   Albumin 3.3 (L) 3.5 - 5.0 g/dL   AST 21 15 - 41 U/L   ALT 22 0 - 44 U/L   Alkaline Phosphatase 62 38 - 126 U/L   Total Bilirubin 0.8 0.3 - 1.2 mg/dL   GFR calc non Af Amer >60 >60 mL/min   GFR calc Af Amer >60 >60  mL/min   Anion gap 12 5 - 15    Comment: Performed at [facility], [place] [address], [place], [place] [address]   Dg Chest Port 1 View  Result Date: 08/14/2019 CLINICAL DATA:  Cough and fever. EXAM: PORTABLE CHEST 1 VIEW COMPARISON:  None FINDINGS: Normal heart size. No pleural effusion identified. Asymmetric airspace consolidation in the left midlung and left base identified compatible with pneumonia. Right lung clear. Visualized osseous structures appear intact. IMPRESSION: 1. Left midlung and left lower lobe pneumonia. 2. Recommend followup imaging to ensure resolution. Electronically Signed   By: Kerby Moors M.D.   On: 08/14/2019 19:29    Pending Labs Unresulted Labs (From admission, onward)    Start     Ordered   08/22/19 0000  Creatinine, serum  (enoxaparin (LOVENOX)    CrCl >/= 30 ml/min)  Weekly,   R    Comments: while on enoxaparin therapy    08/15/19 0007   08/16/19 0500  C-reactive protein  Daily,   R     08/15/19 1033   08/16/19 0500  Ferritin  Daily,   R     08/15/19 1033   08/16/19 0500  D-dimer, quantitative (not at [facility])  Daily,   R     08/15/19 1033   08/16/19 0500  Fibrinogen  Daily,   R  08/15/19 1033   08/16/19 0500  Procalcitonin - Baseline  Daily,   STAT     08/15/19 1033   08/16/19 0500  Comprehensive metabolic panel  Daily,   R     08/15/19 1039   08/15/19 0920  D-dimer, quantitative (not at [facility])  Once,   STAT     08/15/19 0919   08/15/19 0007  HIV Antibody (routine testing w rflx)  (HIV Antibody (Routine testing w reflex) panel)  Once,   STAT     08/15/19 0007   08/14/19 1752  Culture, blood (routine x 2)  BLOOD CULTURE X 2,   STAT     08/14/19 1752          Vitals/Pain Today's Vitals   08/15/19 0930 08/15/19 0945 08/15/19 1023 08/15/19 1030  BP: 127/67  131/65 131/66  Pulse: 76 77 82 71  Resp:   (!) 21   Temp:      TempSrc:      SpO2: 91% 94% 98% 97%  Weight:      Height:      PainSc:        Isolation  Precautions No active isolations  Medications Medications  enoxaparin (LOVENOX) injection 40 mg (40 mg Subcutaneous Given [id number])  folic acid (FOLVITE) tablet 1 mg (has no administration in time range)  thiamine (VITAMIN B-1) tablet 100 mg (has no administration in time range)  azithromycin (ZITHROMAX) 500 mg in sodium chloride 0.9 % 250 mL IVPB (has no administration in time range)  cefTRIAXone (ROCEPHIN) 1 g in sodium chloride 0.9 % 100 mL IVPB (has no administration in time range)  dexamethasone (DECADRON) injection 6 mg (has no administration in time range)  remdesivir 200 mg in sodium chloride 0.9% 250 mL IVPB (has no administration in time range)    Followed by  remdesivir 100 mg in sodium chloride 0.9 % 100 mL IVPB (has no administration in time range)  acetaminophen (TYLENOL) tablet 1,000 mg (1,000 mg Oral Given 08/14/19 1803)  cefTRIAXone (ROCEPHIN) 1 g in sodium chloride 0.9 % 100 mL IVPB (0 g Intravenous Stopped 08/14/19 2127)  azithromycin (ZITHROMAX) 500 mg in sodium chloride 0.9 % 250 mL IVPB (0 mg Intravenous Stopped 08/14/19 2323)  acetaminophen (TYLENOL) tablet 1,000 mg (1,000 mg Oral Given 08/15/19 0038)    Mobility walks

## 2019-08-15 NOTE — Progress Notes (Signed)
PROGRESS NOTE    ADAM RENTSCHLER  [id number] DOB: 1961/08/15 DOA: 08/14/2019 PCP: Patient, No Pcp Per  Outpatient Specialists:    Brief Narrative:  Patient is a 58 year old Caucasian male, overweight, with no documented past medical history.  Patient's problem started about 5 days prior to admission with worsening shortness of breath, low-grade fever, cough, chest congestion and weakness.  Patient tested positive for Covid.  Chest x-ray revealed left middle lobe and left lower lobe pneumonia.  Patient will be transferred to [place] for further assessment and management of pneumonia secondary to severe acute respiratory syndrome Cov-2.  Assessment & Plan:   Active Problems:   CAP (community acquired pneumonia)  Pneumonia secondary to severe acute respiratory syndrome Cov-2: -Transfer patient to [facility]. -IV Decadron. -Remdesivir. -Monitor inflammatory markers. -Supplemental oxygen. -Supportive care. -Further management depend on hospital course.  Overweight: -Further management on outpatient basis. -Diet and exercise.   DVT prophylaxis: Subcutaneous Lovenox Code Status: Full code Family Communication:  Disposition Plan: Transfer patient to [employer]:   None  Procedures:   None  Antimicrobials:   Remdesivir  Azithromycin  Rocephin   Subjective: Patient continues to have low grade fever.  On further questioning, patient endorsed loss of taste and smell, had diarrhea yesterday, coughing and shortness of breath.  Objective: Vitals:   08/15/19 0330 08/15/19 0400 08/15/19 0631 08/15/19 0700  BP: 119/60 115/63 112/63 108/66  Pulse: 67 66 70 65  Resp: 17 (!) 21 17   Temp:      TempSrc:      SpO2: 95% 95% 95% 95%  Weight:      Height:       No intake or output data in the 24 hours ending 08/15/19 0927 Filed Weights   08/14/19 1734  Weight: 90.7 kg    Examination:  General exam: Appears calm and  comfortable.  Patient is overweight. Respiratory system: Decreased air entry globally. Cardiovascular system: S1 & S2 heard, RRR. No JVD, murmurs, rubs, gallops or clicks. No pedal edema. Gastrointestinal system: Abdomen is obese, soft and nontender. No organomegaly or masses felt. Normal bowel sounds heard. Central nervous system: Alert and oriented.  Patient moves all extremities. Extremities: No leg edema.   Data Reviewed: I have personally reviewed following labs and imaging studies  CBC: Recent Labs  Lab 08/14/19 1818 08/15/19 0242  WBC 4.9 4.5  NEUTROABS 3.1  --   HGB 13.6 12.9*  HCT 40.0 38.6*  MCV 89.3 88.9  PLT 152 [id number]   Basic Metabolic Panel: Recent Labs  Lab 08/14/19 1818 08/15/19 0242 08/15/19 0551  NA 132*  --  136  K 3.6  --  3.5  CL 95*  --  99  CO2 26  --  25  GLUCOSE 101*  --  103*  BUN 14  --  15  CREATININE 0.82 0.74 0.65  CALCIUM 8.2*  --  8.3*   GFR: Estimated Creatinine Clearance: 114.6 mL/min (by C-G formula based on SCr of 0.65 mg/dL). Liver Function Tests: Recent Labs  Lab 08/14/19 1818 08/15/19 0551  AST 22 21  ALT 26 22  ALKPHOS 63 62  BILITOT 1.2 0.8  PROT 7.1 6.7  ALBUMIN 3.8 3.3*   No results for input(s): LIPASE, AMYLASE in the last 168 hours. No results for input(s): AMMONIA in the last 168 hours. Coagulation Profile: No results for input(s): INR, PROTIME in the last 168 hours. Cardiac Enzymes: No results for input(s): CKTOTAL, CKMB,  CKMBINDEX, TROPONINI in the last 168 hours. BNP (last 3 results) No results for input(s): PROBNP in the last 8760 hours. HbA1C: No results for input(s): HGBA1C in the last 72 hours. CBG: No results for input(s): GLUCAP in the last 168 hours. Lipid Profile: No results for input(s): CHOL, HDL, LDLCALC, TRIG, CHOLHDL, LDLDIRECT in the last 72 hours. Thyroid Function Tests: No results for input(s): TSH, T4TOTAL, FREET4, T3FREE, THYROIDAB in the last 72 hours. Anemia Panel: No results for  input(s): VITAMINB12, FOLATE, FERRITIN, TIBC, IRON, RETICCTPCT in the last 72 hours. Urine analysis: No results found for: COLORURINE, APPEARANCEUR, LABSPEC, PHURINE, GLUCOSEU, HGBUR, BILIRUBINUR, KETONESUR, PROTEINUR, UROBILINOGEN, NITRITE, LEUKOCYTESUR Sepsis Labs: @LABRCNTIP (procalcitonin:4,lacticidven:4)  ) Recent Results (from the past 240 hour(s))  SARS CORONAVIRUS 2 (TAT 6-24 HRS) Nasopharyngeal Nasopharyngeal Swab     Status: Abnormal   Collection Time: 08/14/19  6:18 PM   Specimen: Nasopharyngeal Swab  Result Value Ref Range Status   SARS Coronavirus 2 POSITIVE (A) NEGATIVE Final    Comment: RESULT CALLED TO, READ BACK BY AND VERIFIED WITH: CEverlean Patterson [id number] 08/15/2019 T. TYSOR (NOTE) SARS-CoV-2 target nucleic acids are DETECTED. The SARS-CoV-2 RNA is generally detectable in upper and lower respiratory specimens during the acute phase of infection. Positive results are indicative of the presence of SARS-CoV-2 RNA. Clinical correlation with patient history and other diagnostic information is  necessary to determine patient infection status. Positive results do not rule out bacterial infection or co-infection with other viruses.  The expected result is Negative. Fact Sheet for Patients: SugarRoll.be Fact Sheet for Healthcare Providers: https://www.woods-mathews.com/ This test is not yet approved or cleared by the [place] FDA and  has been authorized for detection and/or diagnosis of SARS-CoV-2 by FDA under an Emergency Use Authorization (EUA). This EUA will remain  in effect (meaning this test can be used) f or the duration of the COVID-19 declaration under Section 564(b)(1) of the Act, 21 U.S.C. section 360bbb-3(b)(1), unless the authorization is terminated or revoked sooner. Performed at [facility], [place] [address], [place], [place] [address]          Radiology Studies: Dg Chest Port 1 View  Result Date:  08/14/2019 CLINICAL DATA:  Cough and fever. EXAM: PORTABLE CHEST 1 VIEW COMPARISON:  None FINDINGS: Normal heart size. No pleural effusion identified. Asymmetric airspace consolidation in the left midlung and left base identified compatible with pneumonia. Right lung clear. Visualized osseous structures appear intact. IMPRESSION: 1. Left midlung and left lower lobe pneumonia. 2. Recommend followup imaging to ensure resolution. Electronically Signed   By: Kerby Moors M.D.   On: 08/14/2019 19:29        Scheduled Meds: . enoxaparin (LOVENOX) injection  40 mg Subcutaneous QHS  . folic acid  1 mg Oral Daily  . thiamine  100 mg Oral Daily   Continuous Infusions: . azithromycin (ZITHROMAX) 500 MG IVPB (Vial-Mate [employer])    . cefTRIAXone (ROCEPHIN)  IV       LOS: 1 day    Time spent: 35 minutes    Dana Allan, MD  [facility] Pager #: [phone] 7PM-7AM contact night coverage as above

## 2019-08-15 NOTE — ED Notes (Signed)
[facility] called for transport. 

## 2019-08-16 LAB — CBC WITH DIFFERENTIAL/PLATELET
Abs Immature Granulocytes: 0.03 10*3/uL (ref 0.00–0.07)
Basophils Absolute: 0 10*3/uL (ref 0.0–0.1)
Basophils Relative: 0 %
Eosinophils Absolute: 0 10*3/uL (ref 0.0–0.5)
Eosinophils Relative: 0 %
HCT: 40.1 % (ref 39.0–52.0)
Hemoglobin: 13.4 g/dL (ref 13.0–17.0)
Immature Granulocytes: 1 %
Lymphocytes Relative: 17 %
Lymphs Abs: 0.7 10*3/uL (ref 0.7–4.0)
MCH: 29.2 pg (ref 26.0–34.0)
MCHC: 33.4 g/dL (ref 30.0–36.0)
MCV: 87.4 fL (ref 80.0–100.0)
Monocytes Absolute: 0.4 10*3/uL (ref 0.1–1.0)
Monocytes Relative: 8 %
Neutro Abs: 3.2 10*3/uL (ref 1.7–7.7)
Neutrophils Relative %: 74 %
Platelets: 190 10*3/uL (ref 150–400)
RBC: 4.59 MIL/uL (ref 4.22–5.81)
RDW: 13 % (ref 11.5–15.5)
WBC: 4.3 10*3/uL (ref 4.0–10.5)
nRBC: 0 % (ref 0.0–0.2)

## 2019-08-16 LAB — PROCALCITONIN: Procalcitonin: <0.1 ng/mL

## 2019-08-16 LAB — BRAIN NATRIURETIC PEPTIDE: B Natriuretic Peptide: 20.5 pg/mL (ref 0.0–100.0)

## 2019-08-16 LAB — GLUCOSE, CAPILLARY
Glucose-Capillary: 108 mg/dL — ABNORMAL HIGH (ref 70–99)
Glucose-Capillary: 119 mg/dL — ABNORMAL HIGH (ref 70–99)
Glucose-Capillary: 157 mg/dL — ABNORMAL HIGH (ref 70–99)

## 2019-08-16 LAB — COMPREHENSIVE METABOLIC PANEL
ALT: 22 U/L (ref 0–44)
AST: 22 U/L (ref 15–41)
Albumin: 3.6 g/dL (ref 3.5–5.0)
Alkaline Phosphatase: 70 U/L (ref 38–126)
Anion gap: 16 — ABNORMAL HIGH (ref 5–15)
BUN: 14 mg/dL (ref 6–20)
CO2: 25 mmol/L (ref 22–32)
Calcium: 8.8 mg/dL — ABNORMAL LOW (ref 8.9–10.3)
Chloride: 96 mmol/L — ABNORMAL LOW (ref 98–111)
Creatinine, Ser: 0.79 mg/dL (ref 0.61–1.24)
GFR calc Af Amer: >60 mL/min (ref >60)
GFR calc non Af Amer: >60 mL/min (ref >60)
Glucose, Bld: 118 mg/dL — ABNORMAL HIGH (ref 70–99)
Potassium: 4.3 mmol/L (ref 3.5–5.1)
Sodium: 137 mmol/L (ref 135–145)
Total Bilirubin: 0.9 mg/dL (ref 0.3–1.2)
Total Protein: 7.2 g/dL (ref 6.5–8.1)

## 2019-08-16 LAB — MAGNESIUM: Magnesium: 2.3 mg/dL (ref 1.7–2.4)

## 2019-08-16 LAB — D-DIMER, QUANTITATIVE: D-Dimer, Quant: 0.56 ug/mL-FEU — ABNORMAL HIGH (ref 0.00–0.50)

## 2019-08-16 LAB — ABO/RH: ABO/RH(D): A POS

## 2019-08-16 LAB — C-REACTIVE PROTEIN: CRP: 10 mg/dL — ABNORMAL HIGH (ref <1.0)

## 2019-08-16 MED ORDER — ENOXAPARIN SODIUM 40 MG/0.4ML ~~LOC~~ SOLN
40.0000 mg | Freq: Every day | SUBCUTANEOUS | Status: DC
  Administered 2019-08-16 – 2019-08-19 (×4): 40 mg via SUBCUTANEOUS
  Filled 2019-08-16 (×4): qty 0.4

## 2019-08-16 NOTE — Progress Notes (Signed)
Patient refused 0400 insulin for blood sugar 157 stating he just drank apple juice and is not a diabetic

## 2019-08-16 NOTE — Progress Notes (Signed)
PHARMACY CONSULT: Lovenox for VTE prophylaxis   Wt: 90.7 kg, BMI < 35 Scr:   0.8, CrCl >30 ml/hr  CBC wnl D-dimer 0.56  A/P: Lovenox 40 mg Rogers daily.  Gretta Arab PharmD, BCPS Clinical pharmacist phone 7am- 5pm: [phone] 08/16/2019 9:38 AM

## 2019-08-16 NOTE — Progress Notes (Signed)
PROGRESS NOTE                                                                                                                                                                                                             Patient Demographics:    Jorge Gibson, is a 58 y.o. male, DOB - 1961-02-12, BOF:[id number]  Outpatient Primary MD for the patient is Patient, No Pcp Per    LOS - 2  Admit date - 08/14/2019    Chief Complaint  Patient presents with  . Shortness of Breath  . Chest Pain  . Cough       Brief Narrative  Patient is a 58 year old Caucasian male, overweight, with no documented past medical history.  Patient's problem started about 5 days prior to admission with worsening shortness of breath, low-grade fever, cough, chest congestion and weakness.  Patient tested positive for Covid.  Chest x-ray revealed left middle lobe and left lower lobe pneumonia.  Patient will be transferred to [place] for further assessment and management of pneumonia secondary to severe acute respiratory syndrome Cov-2.   Subjective:    Jorge Gibson today has, No headache, No chest pain, No abdominal pain - No Nausea, No new weakness tingling or numbness, mild cough and improved shortness of breath.   Assessment  & Plan :     1. Acute Hypoxic Resp. Failure due to Acute Covid 19 Viral Pneumonitis during the ongoing 2020 Covid 19 Pandemic - he had moderate disease was started on IV steroids along with remdesivir, currently on room air and symptom-free, finish course of his remdesivir.  Monitor inflammatory markers.  Encouraged the patient to sit up in chair in the daytime use I-S and flutter valve for pulmonary toiletry and then prone in bed when at night.   SpO2: 94 %  Recent Labs  Lab 08/14/19 0000 08/14/19 1818 08/15/19 0545 08/15/19 1220 08/15/19 1640 08/16/19 0201  CRP  --   --   --   --  10.0* 10.0*  DDIMER  --   --    --  0.52*  --  0.56*  BNP  --   --  16.1  --   --  20.5  PROCALCITON  --   --   --   --   --  <0.10  SARSCOV2NAA Detected* POSITIVE*  --   --   --   --  Hepatic Function Latest Ref Rng & Units 08/16/2019 08/15/2019 08/14/2019  Total Protein 6.5 - 8.1 g/dL 7.2 6.7 7.1  Albumin 3.5 - 5.0 g/dL 3.6 3.3(L) 3.8  AST 15 - 41 U/L '22 21 22  ' ALT 0 - 44 U/L '22 22 26  ' Alk Phosphatase 38 - 126 U/L 70 62 63  Total Bilirubin 0.3 - 1.2 mg/dL 0.9 0.8 1.2       Condition - Fair  Family Communication  : Daughter called on 08/16/2019 on her listed cell phone number at 9:33 AM, message left  Code Status :  Full  Diet :   Diet Order            Diet regular Room service appropriate? Yes; Fluid consistency: Thin  Diet effective now               Disposition Plan  : Home  Consults  :  None  Procedures  :     PUD Prophylaxis :    DVT Prophylaxis  :  Lovenox ordered  Lab Results  Component Value Date   PLT 190 08/16/2019    Inpatient Medications  Scheduled Meds: . dexamethasone (DECADRON) injection  6 mg Intravenous Q12H  . folic acid  1 mg Oral Daily  . insulin aspart  0-15 Units Subcutaneous Q4H  . thiamine  100 mg Oral Daily   Continuous Infusions: . remdesivir 100 mg in NS 100 mL 100 mg (08/16/19 0832)   PRN Meds:.albuterol, guaiFENesin-dextromethorphan  Antibiotics  :    Anti-infectives (From admission, onward)   Start     Dose/Rate Route Frequency Ordered Stop   08/16/19 1000  remdesivir 100 mg in sodium chloride 0.9 % 100 mL IVPB     100 mg 200 mL/hr over 30 Minutes Intravenous Daily 08/15/19 1037 08/20/19 0959   08/15/19 2200  azithromycin (ZITHROMAX) 500 mg in sodium chloride 0.9 % 250 mL IVPB  Status:  Discontinued     500 mg 250 mL/hr over 60 Minutes Intravenous Daily at bedtime 08/14/19 2212 08/15/19 1545   08/15/19 2000  cefTRIAXone (ROCEPHIN) 1 g in sodium chloride 0.9 % 100 mL IVPB  Status:  Discontinued     1 g 200 mL/hr over 30 Minutes Intravenous  Every 24 hours 08/14/19 2212 08/15/19 1545   08/15/19 1200  remdesivir 200 mg in sodium chloride 0.9% 250 mL IVPB     200 mg 580 mL/hr over 30 Minutes Intravenous Once 08/15/19 1037 08/15/19 1404   08/15/19 0006  cefTRIAXone (ROCEPHIN) 1 g in sodium chloride 0.9 % 100 mL IVPB  Status:  Discontinued     1 g 200 mL/hr over 30 Minutes Intravenous Every 24 hours 08/15/19 0007 08/15/19 0022   08/14/19 1945  cefTRIAXone (ROCEPHIN) 1 g in sodium chloride 0.9 % 100 mL IVPB     1 g 200 mL/hr over 30 Minutes Intravenous  Once 08/14/19 1940 08/14/19 2127   08/14/19 1945  azithromycin (ZITHROMAX) 500 mg in sodium chloride 0.9 % 250 mL IVPB     500 mg 250 mL/hr over 60 Minutes Intravenous  Once 08/14/19 1940 08/14/19 2323       Time Spent in minutes  30   Lala Lund M.D on 08/16/2019 at 9:31 AM  To page go to [email or web address] - password [facility]  [facility] -  Office  [phone]    See all Orders from today for further details    Objective:   Vitals:   08/15/19 1557 08/15/19 1944 08/16/19 0431 08/16/19  0800  BP: 125/63 123/64 123/64 116/71  Pulse:  72 69 70  Resp:  '20 16 18  ' Temp:  99.9 F (37.7 C) 98.6 F (37 C) 97.6 F (36.4 C)  TempSrc:  Oral Oral Oral  SpO2:  93% 95% 94%  Weight:      Height:        Wt Readings from Last 3 Encounters:  08/14/19 90.7 kg     Intake/Output Summary (Last 24 hours) at 08/16/2019 0931 Last data filed at 08/15/2019 1404 Gross per 24 hour  Intake 250 ml  Output -  Net 250 ml     Physical Exam  Awake Alert, No new F.N deficits, Normal affect [place].AT,PERRAL Supple Neck,No JVD, No cervical lymphadenopathy appriciated.  Symmetrical Chest wall movement, Good air movement bilaterally, CTAB RRR,No Gallops,Rubs or new Murmurs, No Parasternal Heave +ve B.Sounds, Abd Soft, No tenderness, No organomegaly appriciated, No rebound - guarding or rigidity. No Cyanosis, Clubbing or edema, No new Rash or bruise      Data Review:    CBC  Recent Labs  Lab 08/14/19 1818 08/15/19 0242 08/16/19 0201  WBC 4.9 4.5 4.3  HGB 13.6 12.9* 13.4  HCT 40.0 38.6* 40.1  PLT 152 151 190  MCV 89.3 88.9 87.4  MCH 30.4 29.7 29.2  MCHC 34.0 33.4 33.4  RDW 13.1 13.1 13.0  LYMPHSABS 1.2  --  0.7  MONOABS 0.6  --  0.4  EOSABS 0.0  --  0.0  BASOSABS 0.0  --  0.0    Chemistries  Recent Labs  Lab 08/14/19 1818 08/15/19 0242 08/15/19 0551 08/16/19 0201  NA 132*  --  136 137  K 3.6  --  3.5 4.3  CL 95*  --  99 96*  CO2 26  --  25 25  GLUCOSE 101*  --  103* 118*  BUN 14  --  15 14  CREATININE 0.82 0.74 0.65 0.79  CALCIUM 8.2*  --  8.3* 8.8*  MG  --   --   --  2.3  AST 22  --  21 22  ALT 26  --  22 22  ALKPHOS 63  --  62 70  BILITOT 1.2  --  0.8 0.9   ------------------------------------------------------------------------------------------------------------------ No results for input(s): CHOL, HDL, LDLCALC, TRIG, CHOLHDL, LDLDIRECT in the last 72 hours.  No results found for: HGBA1C ------------------------------------------------------------------------------------------------------------------ No results for input(s): TSH, T4TOTAL, T3FREE, THYROIDAB in the last 72 hours.  Invalid input(s): FREET3  Cardiac Enzymes No results for input(s): CKMB, TROPONINI, MYOGLOBIN in the last 168 hours.  Invalid input(s): CK ------------------------------------------------------------------------------------------------------------------    Component Value Date/Time   BNP 20.5 08/16/2019 0201    Micro Results Recent Results (from the past 240 hour(s))  Novel Coronavirus, NAA ([facility])     Status: Abnormal   Collection Time: 08/14/19 12:00 AM   Specimen: Nasopharyngeal(NP) swabs in vial transport medium   NASOPHARYNGE  TESTING  Result Value Ref Range Status   SARS-CoV-2, NAA Detected (A) Not Detected Final    Comment: Testing was performed using the cobas(R) SARS-CoV-2 test. This nucleic acid amplification test was developed  and its performance characteristics determined by [employer]. Nucleic acid amplification tests include PCR and TMA. This test has not been FDA cleared or approved. This test has been authorized by FDA under an Emergency Use Authorization (EUA). This test is only authorized for the duration of time the declaration that circumstances exist justifying the authorization of the emergency use of in vitro diagnostic  tests for detection of SARS-CoV-2 virus and/or diagnosis of COVID-19 infection under section 564(b)(1) of the Act, 21 U.S.C. 774JOI-7(O) (1), unless the authorization is terminated or revoked sooner. When diagnostic testing is negative, the possibility of a false negative result should be considered in the context of a patient's recent exposures and the presence of clinical signs and symptoms consistent with COVID-19. An individual without symptoms  of COVID-19 and who is not shedding SARS-CoV-2 virus would expect to have a negative (not detected) result in this assay.   SARS CORONAVIRUS 2 (TAT 6-24 HRS) Nasopharyngeal Nasopharyngeal Swab     Status: Abnormal   Collection Time: 08/14/19  6:18 PM   Specimen: Nasopharyngeal Swab  Result Value Ref Range Status   SARS Coronavirus 2 POSITIVE (A) NEGATIVE Final    Comment: RESULT CALLED TO, READ BACK BY AND VERIFIED WITH: CEverlean Patterson [id number] 08/15/2019 T. TYSOR (NOTE) SARS-CoV-2 target nucleic acids are DETECTED. The SARS-CoV-2 RNA is generally detectable in upper and lower respiratory specimens during the acute phase of infection. Positive results are indicative of the presence of SARS-CoV-2 RNA. Clinical correlation with patient history and other diagnostic information is  necessary to determine patient infection status. Positive results do not rule out bacterial infection or co-infection with other viruses.  The expected result is Negative. Fact Sheet for Patients: SugarRoll[employer]be Fact  Sheet for Healthcare Providers: https://www.woods-mathews.com/ This test is not yet approved or cleared by the [place] FDA and  has been authorized for detection and/or diagnosis of SARS-CoV-2 by FDA under an Emergency Use Authorization (EUA). This EUA will remain  in effect (meaning this test can be used) f or the duration of the COVID-19 declaration under Section 564(b)(1) of the Act, 21 U.S.C. section 360bbb-3(b)(1), unless the authorization is terminated or revoked sooner. Performed at [facility], [place] [address] [place], [place] [address]   Culture, blood (routine x 2)     Status: None (Preliminary result)   Collection Time: 08/14/19  6:18 PM   Specimen: BLOOD RIGHT HAND  Result Value Ref Range Status   Specimen Description   Final    BLOOD RIGHT HAND Performed at [facility], [place] [address] [place], [place] [address]    Special Requests   Final    BOTTLES DRAWN AEROBIC AND ANAEROBIC Blood Culture adequate volume Performed at [facility], [place] [address], [place], [place] [address]    Culture   Final    NO GROWTH 2 DAYS Performed at [place] [address] [place], [place] [address]    Report Status PENDING  Incomplete  Culture, blood (routine x 2)     Status: None (Preliminary result)   Collection Time: 08/14/19  6:18 PM   Specimen: BLOOD  Result Value Ref Range Status   Specimen Description   Final    BLOOD RIGHT ANTECUBITAL Performed at [facility], [place] [address] [place], [place] [address]    Special Requests   Final    BOTTLES DRAWN AEROBIC AND ANAEROBIC Blood Culture adequate volume Performed at [facility], [place] [address] [place], [place] [address]    Culture   Final    NO GROWTH 2 DAYS Performed at [place] [address], [place], [place] [address]    Report Status PENDING  Incomplete    Radiology  Reports Dg Chest Port 1 View  Result Date: 08/14/2019 CLINICAL DATA:  Cough and fever.  EXAM: PORTABLE CHEST 1 VIEW COMPARISON:  None FINDINGS: Normal heart size. No pleural effusion identified. Asymmetric airspace consolidation in the left midlung and left base identified compatible with pneumonia. Right lung clear. Visualized osseous structures appear intact. IMPRESSION: 1. Left midlung and left lower lobe pneumonia. 2. Recommend followup imaging to ensure resolution. Electronically Signed   By: Kerby Moors M.D.   On: 08/14/2019 19:29

## 2019-08-17 LAB — COMPREHENSIVE METABOLIC PANEL
ALT: 23 U/L (ref 0–44)
AST: 21 U/L (ref 15–41)
Albumin: 3.3 g/dL — ABNORMAL LOW (ref 3.5–5.0)
Alkaline Phosphatase: 60 U/L (ref 38–126)
Anion gap: 11 (ref 5–15)
BUN: 18 mg/dL (ref 6–20)
CO2: 28 mmol/L (ref 22–32)
Calcium: 8.5 mg/dL — ABNORMAL LOW (ref 8.9–10.3)
Chloride: 99 mmol/L (ref 98–111)
Creatinine, Ser: 0.71 mg/dL (ref 0.61–1.24)
GFR calc Af Amer: >60 mL/min (ref >60)
GFR calc non Af Amer: >60 mL/min (ref >60)
Glucose, Bld: 142 mg/dL — ABNORMAL HIGH (ref 70–99)
Potassium: 4.1 mmol/L (ref 3.5–5.1)
Sodium: 138 mmol/L (ref 135–145)
Total Bilirubin: 0.7 mg/dL (ref 0.3–1.2)
Total Protein: 6.7 g/dL (ref 6.5–8.1)

## 2019-08-17 LAB — CBC WITH DIFFERENTIAL/PLATELET
Abs Immature Granulocytes: 0.06 10*3/uL (ref 0.00–0.07)
Basophils Absolute: 0 10*3/uL (ref 0.0–0.1)
Basophils Relative: 0 %
Eosinophils Absolute: 0 10*3/uL (ref 0.0–0.5)
Eosinophils Relative: 0 %
HCT: 39.9 % (ref 39.0–52.0)
Hemoglobin: 13.4 g/dL (ref 13.0–17.0)
Immature Granulocytes: 1 %
Lymphocytes Relative: 11 %
Lymphs Abs: 0.8 10*3/uL (ref 0.7–4.0)
MCH: 29.5 pg (ref 26.0–34.0)
MCHC: 33.6 g/dL (ref 30.0–36.0)
MCV: 87.7 fL (ref 80.0–100.0)
Monocytes Absolute: 0.7 10*3/uL (ref 0.1–1.0)
Monocytes Relative: 10 %
Neutro Abs: 5.8 10*3/uL (ref 1.7–7.7)
Neutrophils Relative %: 78 %
Platelets: 231 10*3/uL (ref 150–400)
RBC: 4.55 MIL/uL (ref 4.22–5.81)
RDW: 12.7 % (ref 11.5–15.5)
WBC: 7.4 10*3/uL (ref 4.0–10.5)
nRBC: 0 % (ref 0.0–0.2)

## 2019-08-17 LAB — PROCALCITONIN: Procalcitonin: <0.1 ng/mL

## 2019-08-17 LAB — C-REACTIVE PROTEIN: CRP: 3.6 mg/dL — ABNORMAL HIGH (ref <1.0)

## 2019-08-17 LAB — MAGNESIUM: Magnesium: 2.2 mg/dL (ref 1.7–2.4)

## 2019-08-17 LAB — BRAIN NATRIURETIC PEPTIDE: B Natriuretic Peptide: 22.2 pg/mL (ref 0.0–100.0)

## 2019-08-17 LAB — GLUCOSE, CAPILLARY: Glucose-Capillary: 128 mg/dL — ABNORMAL HIGH (ref 70–99)

## 2019-08-17 LAB — D-DIMER, QUANTITATIVE: D-Dimer, Quant: 0.41 ug/mL-FEU (ref 0.00–0.50)

## 2019-08-17 MED ORDER — DEXAMETHASONE SODIUM PHOSPHATE 4 MG/ML IJ SOLN
4.0000 mg | INTRAMUSCULAR | Status: DC
  Filled 2019-08-17: qty 1

## 2019-08-17 NOTE — Progress Notes (Signed)
PROGRESS NOTE                                                                                                                                                                                                             Patient Demographics:    Jorge Gibson, is a 58 y.o. male, DOB - November 09, 1960, UKG:[id number]  Outpatient Primary MD for the patient is Patient, No Pcp Per    LOS - 3  Admit date - 08/14/2019    Chief Complaint  Patient presents with  . Shortness of Breath  . Chest Pain  . Cough       Brief Narrative  Patient is a 58 year old Caucasian male, overweight, with no documented past medical history.  Patient's problem started about 5 days prior to admission with worsening shortness of breath, low-grade fever, cough, chest congestion and weakness.  Patient tested positive for Covid.  Chest x-ray revealed left middle lobe and left lower lobe pneumonia.  Patient will be transferred to [place] for further assessment and management of pneumonia secondary to severe acute respiratory syndrome Cov-2.   Subjective:   Patient in bed, appears comfortable, denies any headache, no fever, no chest pain or pressure, no shortness of breath , no abdominal pain. No focal weakness.    Assessment  & Plan :     1. Acute Hypoxic Resp. Failure due to Acute Covid 19 Viral Pneumonitis during the ongoing 2020 Covid 19 Pandemic - he had moderate disease was started on IV steroids along with remdesivir, currently on room air and symptom-free, finish course of his remdesivir. Start tapering steroids, advance activity.  Encouraged the patient to sit up in chair in the daytime use I-S and flutter valve for pulmonary toiletry and then prone in bed when at night.   SpO2: 90 %  Recent Labs  Lab 08/14/19 0000 08/14/19 1818 08/15/19 0545 08/15/19 1220 08/15/19 1640 08/16/19 0201 08/17/19 0245  CRP  --   --   --   --  10.0*  10.0* 3.6*  DDIMER  --   --   --  0.52*  --  0.56* 0.41  BNP  --   --  16.1  --   --  20.5 22.2  PROCALCITON  --   --   --   --   --  <0.10 <0.10  SARSCOV2NAA Detected* POSITIVE*  --   --   --   --   --     Hepatic Function Latest Ref Rng & Units 08/17/2019 08/16/2019 08/15/2019  Total Protein 6.5 - 8.1 g/dL 6.7 7.2 6.7  Albumin 3.5 - 5.0 g/dL 3.3(L) 3.6 3.3(L)  AST 15 - 41 U/L '21 22 21  ' ALT 0 - 44 U/L '23 22 22  ' Alk Phosphatase 38 - 126 U/L 60 70 62  Total Bilirubin 0.3 - 1.2 mg/dL 0.7 0.9 0.8       Condition - Fair  Family Communication  : Daughter called on 08/16/2019 on her listed cell phone number at 9:33 AM, message left  Code Status :  Full  Diet :   Diet Order            Diet regular Room service appropriate? Yes; Fluid consistency: Thin  Diet effective now               Disposition Plan  : Home  Consults  :  None  Procedures  :     PUD Prophylaxis :    DVT Prophylaxis  :  Lovenox ordered  Lab Results  Component Value Date   PLT 231 08/17/2019    Inpatient Medications  Scheduled Meds: . dexamethasone (DECADRON) injection  6 mg Intravenous Q12H  . enoxaparin (LOVENOX) injection  40 mg Subcutaneous Daily  . folic acid  1 mg Oral Daily  . thiamine  100 mg Oral Daily   Continuous Infusions: . remdesivir 100 mg in NS 100 mL 100 mg (08/17/19 0839)   PRN Meds:.albuterol, guaiFENesin-dextromethorphan  Antibiotics  :    Anti-infectives (From admission, onward)   Start     Dose/Rate Route Frequency Ordered Stop   08/16/19 1000  remdesivir 100 mg in sodium chloride 0.9 % 100 mL IVPB     100 mg 200 mL/hr over 30 Minutes Intravenous Daily 08/15/19 1037 08/20/19 0959   08/15/19 2200  azithromycin (ZITHROMAX) 500 mg in sodium chloride 0.9 % 250 mL IVPB  Status:  Discontinued     500 mg 250 mL/hr over 60 Minutes Intravenous Daily at bedtime 08/14/19 2212 08/15/19 1545   08/15/19 2000  cefTRIAXone (ROCEPHIN) 1 g in sodium chloride 0.9 % 100 mL IVPB  Status:   Discontinued     1 g 200 mL/hr over 30 Minutes Intravenous Every 24 hours 08/14/19 2212 08/15/19 1545   08/15/19 1200  remdesivir 200 mg in sodium chloride 0.9% 250 mL IVPB     200 mg 580 mL/hr over 30 Minutes Intravenous Once 08/15/19 1037 08/15/19 1404   08/15/19 0006  cefTRIAXone (ROCEPHIN) 1 g in sodium chloride 0.9 % 100 mL IVPB  Status:  Discontinued     1 g 200 mL/hr over 30 Minutes Intravenous Every 24 hours 08/15/19 0007 08/15/19 0022   08/14/19 1945  cefTRIAXone (ROCEPHIN) 1 g in sodium chloride 0.9 % 100 mL IVPB     1 g 200 mL/hr over 30 Minutes Intravenous  Once 08/14/19 1940 08/14/19 2127   08/14/19 1945  azithromycin (ZITHROMAX) 500 mg in sodium chloride 0.9 % 250 mL IVPB     500 mg 250 mL/hr over 60 Minutes Intravenous  Once 08/14/19 1940 08/14/19 2323       Time Spent in minutes  30   Lala Lund M.D on 08/17/2019 at 9:48 AM  To page go to [email or web address] - password [facility]  [facility] -  Office  [phone]    See all  Orders from today for further details    Objective:   Vitals:   08/16/19 0800 08/16/19 1600 08/16/19 1946 08/17/19 0625  BP: 116/71 126/63 133/67 123/60  Pulse: 70 60 60 62  Resp: '18 17 18   ' Temp: 97.6 F (36.4 C) 99.3 F (37.4 C) 98.7 F (37.1 C) 98 F (36.7 C)  TempSrc: Oral Oral Oral Oral  SpO2: 94% 92% 93% 90%  Weight:      Height:        Wt Readings from Last 3 Encounters:  08/14/19 90.7 kg     Intake/Output Summary (Last 24 hours) at 08/17/2019 0948 Last data filed at 08/17/2019 0656 Gross per 24 hour  Intake 580 ml  Output -  Net 580 ml     Physical Exam  Awake Alert,   No new F.N deficits, Normal affect K[place]pp.AT,PERRAL Supple Neck,No JVD, No cervical lymphadenopathy appriciated.  Symmetrical Chest wall movement, Good air movement bilaterally, CTAB RRR,No Gallops, Rubs or new Murmurs, No Parasternal Heave +ve B.Sounds, Abd Soft, No tenderness, No organomegaly appriciated, No rebound - guarding or rigidity.  No Cyanosis, Clubbing or edema, No new Rash or bruise    Data Review:    CBC Recent Labs  Lab 08/14/19 1818 08/15/19 0242 08/16/19 0201 08/17/19 0245  WBC 4.9 4.5 4.3 7.4  HGB 13.6 12.9* 13.4 13.4  HCT 40.0 38.6* 40.1 39.9  PLT 152 151 190 231  MCV 89.3 88.9 87.4 87.7  MCH 30.4 29.7 29.2 29.5  MCHC 34.0 33.4 33.4 33.6  RDW 13.1 13.1 13.0 12.7  LYMPHSABS 1.2  --  0.7 0.8  MONOABS 0.6  --  0.4 0.7  EOSABS 0.0  --  0.0 0.0  BASOSABS 0.0  --  0.0 0.0    Chemistries  Recent Labs  Lab 08/14/19 1818 08/15/19 0242 08/15/19 0551 08/16/19 0201 08/17/19 0245  NA 132*  --  136 137 138  K 3.6  --  3.5 4.3 4.1  CL 95*  --  99 96* 99  CO2 26  --  '25 25 28  ' GLUCOSE 101*  --  103* 118* 142*  BUN 14  --  '15 14 18  ' CREATININE 0.82 0.74 0.65 0.79 0.71  CALCIUM 8.2*  --  8.3* 8.8* 8.5*  MG  --   --   --  2.3 2.2  AST 22  --  '21 22 21  ' ALT 26  --  '22 22 23  ' ALKPHOS 63  --  62 70 60  BILITOT 1.2  --  0.8 0.9 0.7   ------------------------------------------------------------------------------------------------------------------ No results for input(s): CHOL, HDL, LDLCALC, TRIG, CHOLHDL, LDLDIRECT in the last 72 hours.  No results found for: HGBA1C ------------------------------------------------------------------------------------------------------------------ No results for input(s): TSH, T4TOTAL, T3FREE, THYROIDAB in the last 72 hours.  Invalid input(s): FREET3  Cardiac Enzymes No results for input(s): CKMB, TROPONINI, MYOGLOBIN in the last 168 hours.  Invalid input(s): CK ------------------------------------------------------------------------------------------------------------------    Component Value Date/Time   BNP 22.2 08/17/2019 0245    Micro Results Recent Results (from the past 240 hour(s))  Novel Coronavirus, NAA ([facility])     Status: Abnormal   Collection Time: 08/14/19 12:00 AM   Specimen: Nasopharyngeal(NP) swabs in vial transport medium   NASOPHARYNGE   TESTING  Result Value Ref Range Status   SARS-CoV-2, NAA Detected (A) Not Detected Final    Comment: Testing was performed using the cobas(R) SARS-CoV-2 test. This nucleic acid amplification test was developed and its performance characteristics determined by [employer]. Nucleic  acid amplification tests include PCR and TMA. This test has not been FDA cleared or approved. This test has been authorized by FDA under an Emergency Use Authorization (EUA). This test is only authorized for the duration of time the declaration that circumstances exist justifying the authorization of the emergency use of in vitro diagnostic tests for detection of SARS-CoV-2 virus and/or diagnosis of COVID-19 infection under section 564(b)(1) of the Act, 21 U.S.C. 983JAS-5(K) (1), unless the authorization is terminated or revoked sooner. When diagnostic testing is negative, the possibility of a false negative result should be considered in the context of a patient's recent exposures and the presence of clinical signs and symptoms consistent with COVID-19. An individual without symptoms  of COVID-19 and who is not shedding SARS-CoV-2 virus would expect to have a negative (not detected) result in this assay.   SARS CORONAVIRUS 2 (TAT 6-24 HRS) Nasopharyngeal Nasopharyngeal Swab     Status: Abnormal   Collection Time: 08/14/19  6:18 PM   Specimen: Nasopharyngeal Swab  Result Value Ref Range Status   SARS Coronavirus 2 POSITIVE (A) NEGATIVE Final    Comment: RESULT CALLED TO, READ BACK BY AND VERIFIED WITH: CEverlean Patterson [id number] 08/15/2019 T. TYSOR (NOTE) SARS-CoV-2 target nucleic acids are DETECTED. The SARS-CoV-2 RNA is generally detectable in upper and lower respiratory specimens during the acute phase of infection. Positive results are indicative of the presence of SARS-CoV-2 RNA. Clinical correlation with patient history and other diagnostic information is  necessary to determine patient  infection status. Positive results do not rule out bacterial infection or co-infection with other viruses.  The expected result is Negative. Fact Sheet for Patients: SugarRoll.be Fact Sheet for Healthcare Providers: https://www.woods-mathews.com/ This test is not yet approved or cleared by the [place] FDA and  has been authorized for detection and/or diagnosis of SARS-CoV-2 by FDA under an Emergency Use Authorization (EUA). This EUA will remain  in effect (meaning this test can be used) f or the duration of the COVID-19 declaration under Section 564(b)(1) of the Act, 21 U.S.C. section 360bbb-3(b)(1), unless the authorization is terminated or revoked sooner. Performed at [facility], [place] [address], [place], [place] [address]   Culture, blood (routine x 2)     Status: None (Preliminary result)   Collection Time: 08/14/19  6:18 PM   Specimen: BLOOD RIGHT HAND  Result Value Ref Range Status   Specimen Description   Final    BLOOD RIGHT HAND Performed at [facility], [place] [address], [place], [place] [address]    Special Requests   Final    BOTTLES DRAWN AEROBIC AND ANAEROBIC Blood Culture adequate volume Performed at [facility], [place] [address], [place], [place] [address]    Culture   Final    NO GROWTH 2 DAYS Performed at [place] [address], [place], [place] [address]    Report Status PENDING  Incomplete  Culture, blood (routine x 2)     Status: None (Preliminary result)   Collection Time: 08/14/19  6:18 PM   Specimen: BLOOD  Result Value Ref Range Status   Specimen Description   Final    BLOOD RIGHT ANTECUBITAL Performed at [facility], [place] [address] [place], [place] [address]    Special Requests   Final    BOTTLES DRAWN AEROBIC AND ANAEROBIC Blood Culture adequate volume Performed at [facility], [place] [address], [place],  [address]  Culture   Final    NO GROWTH 2 DAYS Performed at [facility], [place] [address] [place], [place] [address]    Report Status PENDING  Incomplete    Radiology Reports Dg Chest Port 1 View  Result Date: 08/14/2019 CLINICAL DATA:  Cough and fever. EXAM: PORTABLE CHEST 1 VIEW COMPARISON:  None FINDINGS: Normal heart size. No pleural effusion identified. Asymmetric airspace consolidation in the left midlung and left base identified compatible with pneumonia. Right lung clear. Visualized osseous structures appear intact. IMPRESSION: 1. Left midlung and left lower lobe pneumonia. 2. Recommend followup imaging to ensure resolution. Electronically Signed   By: Kerby Moors M.D.   On: 08/14/2019 19:29

## 2019-08-17 NOTE — Progress Notes (Signed)
PT Cancellation Note  Patient Details Name: Jorge Gibson MRN: [id number] DOB: 1961/01/17   Cancelled Treatment:    Reason Eval/Treat Not Completed: Fatigue/lethargy limiting ability to participate, recently up w/ OT, up ad lib to BR. Will check back tomorrow.    Claretha Cooper 08/17/2019, 4:02 PM [place] Pager [phone] Office [phone]

## 2019-08-17 NOTE — Treatment Plan (Signed)
1833Pt arrived via w/c from Rm 117.  Pt alert and oriented x3, VSS.  Pt possibly discharging tomorrow  Pt currently on RA no exertion noted with transfer

## 2019-08-17 NOTE — Progress Notes (Signed)
Occupational Therapy Evaluation Patient Details Name: Jorge Gibson MRN: [id number] DOB: 04/08/1961 Today's Date: 08/17/2019    History of Present Illness Patient is a 58 year old Caucasian male, overweight, with no documented past medical history.  Patient's problem started about 5 days prior to admission with worsening shortness of breath, low-grade fever, cough, chest congestion and weakness.  Patient tested positive for Covid.  Chest x-ray revealed left middle lobe and left lower lobe pneumonia.  Patient will be transferred to [place] for further assessment and management of pneumonia secondary to severe acute respiratory syndrome Cov-2.   Clinical Impression   PTA pt lived alone, independent with ADLs, IADLs, and mobility tasks. Pt still drives and works full time. Pt currently requires setup to supervision for self-care and functional transfer tasks. Pt able to ambulate to/from bathroom with supervision and complete toileting task. Noted 0 instances of LOB, however pt unsteady on his feet. O2 SATs dropped to 89% on RA following activity with quick return back to 92%. 2/4 DOE. Pt reports slight anxiety related to SOB with education provided regarding relaxation strategies. Provided pt handouts in regards to energy conservation, pursed lip breathing, and meal delivery services as he lives alone. Pt demonstrates decreased strength, endurance, balance, standing tolerance, and activity tolerance impacting ability to complete self-care and functional transfer tasks. Recommend skilled OT services to address above deficits in order to promote function and prevent further decline. Recommend [facility] OT for continued rehab following hospital discharge as pt lives alone.  BP Sitting: 123/86mmHg.  BP Standing: 129/41mmHg Pt reports dizziness throughout all tasks     Follow Up Recommendations  Home health OT    Equipment Recommendations  Other (comment)(shower chair)    Recommendations for  Other Services       Precautions / Restrictions Precautions Precautions: Fall Restrictions Weight Bearing Restrictions: No      Mobility Bed Mobility                  Transfers Overall transfer level: Needs assistance Equipment used: None Transfers: Sit to/from Stand Sit to Stand: Supervision         General transfer comment: Pt able to ambulate to/from bathroom without an assistive device. Noted 0 instances of LOB, however pt unsteady on feet.    Balance Overall balance assessment: Mild deficits observed, not formally tested                                         ADL either performed or assessed with clinical judgement   ADL Overall ADL's : Needs assistance/impaired Eating/Feeding: Independent   Grooming: Supervision/safety;Standing   Upper Body Bathing: Supervision/ safety;Standing   Lower Body Bathing: Supervison/ safety;Sit to/from stand   Upper Body Dressing : Independent;Sitting   Lower Body Dressing: Supervision/safety;Sit to/from stand   Toilet Transfer: Sales executive;Ambulation   Toileting- Clothing Manipulation and Hygiene: Supervision/safety;Sit to/from stand       Functional mobility during ADLs: Supervision/safety       Vision Baseline Vision/History: Wears glasses Wears Glasses: At all times       Perception     Praxis      Pertinent Vitals/Pain Pain Assessment: 0-10 Pain Score: 5  Pain Location: chest from coughing Pain Descriptors / Indicators: (soreness) Pain Intervention(s): Monitored during session;Relaxation;Repositioned     Hand Dominance Right   Extremity/Trunk Assessment Upper Extremity Assessment Upper Extremity Assessment: Generalized weakness  Lower Extremity Assessment Lower Extremity Assessment: Defer to PT evaluation       Communication Communication Communication: No difficulties   Cognition Arousal/Alertness: Awake/alert Behavior During Therapy: WFL for  tasks assessed/performed Overall Cognitive Status: Within Functional Limits for tasks assessed                                     General Comments  Pt reports dizziness throughout all tasks. BP sitting 123/47mmHg and BP standing 129/19mmHg. Pt's O2 SATs decreased to 89% after mobility and self-care tasks with quick return back to 90s. Educated/instructed pt on safety strategies, activity modifications, and energy conservation techniques. Provided pt with handouts in regards to energy conservation, meal delivery services, and pursed lip breathing.    Exercises Exercises: Other exercises Other Exercises Other Exercises: Incentive spirometer x 10 with min cues on technique. Pulling 1534mL Other Exercises: Flutter valve x 10 with min cues on technique. Other Exercises: Pursed lip breathing with min cues on technique.   Shoulder Instructions      Home Living Family/patient expects to be discharged to:: Private residence Living Arrangements: Alone Available Help at Discharge: Other (Comment)(none) Type of Home: House Home Access: Stairs to enter Entrance Stairs-Number of Steps: 4   Home Layout: Two level;Able to live on main level with bedroom/bathroom     Bathroom Shower/Tub: Occupational psychologist: Standard     Home Equipment: None          Prior Functioning/Environment Level of Independence: Independent        Comments: Independent in ADLs, IADLs, and mobility. Does not ambulate with an assistive device. 0 falls in the last 6 months. Does not use O2 at home. 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        OT Problem List: Decreased strength;Decreased activity tolerance;Impaired balance (sitting and/or standing);Decreased knowledge of use of DME or AE;Cardiopulmonary status limiting activity      OT Treatment/Interventions: Self-care/ADL training;Therapeutic exercise;Neuromuscular education;Energy conservation;DME and/or AE  instruction;Therapeutic activities;Patient/family education;Balance training    OT Goals(Current goals can be found in the care plan section) Acute Rehab OT Goals Patient Stated Goal: To go home Time For Goal Achievement: 08/31/19 Potential to Achieve Goals: Good ADL Goals Pt Will Perform Grooming: Independently;standing Pt Will Perform Lower Body Bathing: Independently;sit to/from stand Pt Will Perform Lower Body Dressing: Independently;sit to/from stand Pt Will Transfer to Toilet: Independently;ambulating;regular height toilet Pt Will Perform Toileting - Clothing Manipulation and hygiene: Independently;sit to/from stand Additional ADL Goal #1: Pt to recall and demonstrate breathing exercises with 0 verbal cues on technique.  OT Frequency: Min 3X/week   Barriers to D/C: Other (comment)(Pt lives alone with no one to assist him at home)          Co-evaluation              AM-PAC OT "6 Clicks" Daily Activity     Outcome Measure Help from another person eating meals?: None Help from another person taking care of personal grooming?: A Little Help from another person toileting, which includes using toliet, bedpan, or urinal?: A Little Help from another person bathing (including washing, rinsing, drying)?: A Little Help from another person to put on and taking off regular upper body clothing?: None Help from another person to put on and taking off regular lower body clothing?: A Little 6 Click Score: 20   End of Session Nurse Communication:  Mobility status  Activity Tolerance: Patient limited by fatigue(Limited by SOB) Patient left: in chair;with call bell/phone within reach  OT Visit Diagnosis: Unsteadiness on feet (R26.81);Muscle weakness (generalized) (M62.81)                Time: [id number] OT Time Calculation (min): 33 min Charges:  OT General Charges $OT Visit: 1 Visit OT Evaluation $OT Eval Moderate Complexity: 1 Mod OT Treatments $Self Care/Home Management : 8-22  mins  Mauri Brooklyn OTR/L [phone]   Mauri Brooklyn 08/17/2019, 2:17 PM

## 2019-08-18 LAB — CBC WITH DIFFERENTIAL/PLATELET
Abs Immature Granulocytes: 0.05 10*3/uL (ref 0.00–0.07)
Basophils Absolute: 0 10*3/uL (ref 0.0–0.1)
Basophils Relative: 0 %
Eosinophils Absolute: 0 10*3/uL (ref 0.0–0.5)
Eosinophils Relative: 0 %
HCT: 39.6 % (ref 39.0–52.0)
Hemoglobin: 13.1 g/dL (ref 13.0–17.0)
Immature Granulocytes: 1 %
Lymphocytes Relative: 17 %
Lymphs Abs: 1.3 10*3/uL (ref 0.7–4.0)
MCH: 29.2 pg (ref 26.0–34.0)
MCHC: 33.1 g/dL (ref 30.0–36.0)
MCV: 88.4 fL (ref 80.0–100.0)
Monocytes Absolute: 1.1 10*3/uL — ABNORMAL HIGH (ref 0.1–1.0)
Monocytes Relative: 15 %
Neutro Abs: 5 10*3/uL (ref 1.7–7.7)
Neutrophils Relative %: 67 %
Platelets: 247 10*3/uL (ref 150–400)
RBC: 4.48 MIL/uL (ref 4.22–5.81)
RDW: 12.9 % (ref 11.5–15.5)
WBC: 7.4 10*3/uL (ref 4.0–10.5)
nRBC: 0 % (ref 0.0–0.2)

## 2019-08-18 LAB — COMPREHENSIVE METABOLIC PANEL
ALT: 27 U/L (ref 0–44)
AST: 22 U/L (ref 15–41)
Albumin: 3.3 g/dL — ABNORMAL LOW (ref 3.5–5.0)
Alkaline Phosphatase: 64 U/L (ref 38–126)
Anion gap: 12 (ref 5–15)
BUN: 17 mg/dL (ref 6–20)
CO2: 26 mmol/L (ref 22–32)
Calcium: 8.3 mg/dL — ABNORMAL LOW (ref 8.9–10.3)
Chloride: 101 mmol/L (ref 98–111)
Creatinine, Ser: 0.71 mg/dL (ref 0.61–1.24)
GFR calc Af Amer: >60 mL/min (ref >60)
GFR calc non Af Amer: >60 mL/min (ref >60)
Glucose, Bld: 103 mg/dL — ABNORMAL HIGH (ref 70–99)
Potassium: 3.7 mmol/L (ref 3.5–5.1)
Sodium: 139 mmol/L (ref 135–145)
Total Bilirubin: 0.8 mg/dL (ref 0.3–1.2)
Total Protein: 6.4 g/dL — ABNORMAL LOW (ref 6.5–8.1)

## 2019-08-18 LAB — MAGNESIUM: Magnesium: 2.3 mg/dL (ref 1.7–2.4)

## 2019-08-18 LAB — PROCALCITONIN: Procalcitonin: <0.1 ng/mL

## 2019-08-18 LAB — D-DIMER, QUANTITATIVE: D-Dimer, Quant: 0.37 ug/mL-FEU (ref 0.00–0.50)

## 2019-08-18 LAB — BRAIN NATRIURETIC PEPTIDE: B Natriuretic Peptide: 39.4 pg/mL (ref 0.0–100.0)

## 2019-08-18 LAB — C-REACTIVE PROTEIN: CRP: 1.2 mg/dL — ABNORMAL HIGH (ref <1.0)

## 2019-08-18 MED ORDER — DEXAMETHASONE SODIUM PHOSPHATE 10 MG/ML IJ SOLN
2.0000 mg | INTRAMUSCULAR | Status: DC
  Administered 2019-08-19: 2 mg via INTRAVENOUS
  Filled 2019-08-18: qty 1

## 2019-08-18 MED ORDER — DEXAMETHASONE SODIUM PHOSPHATE 10 MG/ML IJ SOLN
4.0000 mg | Freq: Once | INTRAMUSCULAR | Status: AC
  Administered 2019-08-18: 4 mg via INTRAVENOUS
  Filled 2019-08-18: qty 1

## 2019-08-18 NOTE — Plan of Care (Signed)
Pt slept well during the night. No complaints of pain verbalized. Alert and oriented. Vitals stable on RA. Minimal dyspnea on exertion and productive cough noted, prn Robitussin given. Independent with ADLs. IV SL. No other issues, will monitor.   Problem: Education: Goal: Knowledge of risk factors and measures for prevention of condition will improve Outcome: Progressing   Problem: Respiratory: Goal: Will maintain a patent airway Outcome: Progressing Goal: Complications related to the disease process, condition or treatment will be avoided or minimized Outcome: Progressing   Problem: Coping: Goal: Level of anxiety will decrease Outcome: Progressing

## 2019-08-18 NOTE — Evaluation (Signed)
Physical Therapy Evaluation Patient Details Name: Jorge Gibson MRN: [id number] DOB: Jul 02, 1961 Today's Date: 08/18/2019   History of Present Illness  Patient is a 58 year old Caucasian male, overweight, with no documented past medical history.  Patient's problem started about 5 days prior to admission with worsening shortness of breath, low-grade fever, cough, chest congestion and weakness.  Patient tested positive for Covid.  Chest x-ray revealed left middle lobe and left lower lobe pneumonia.  Patient will be transferred to [place] for further assessment and management of pneumonia secondary to severe acute respiratory syndrome Cov-2.  Clinical Impression  The patient is weak ans slightly unsteady, reports p in room ad lib. Patient ambulated x 200' on RA SPO2 90% post, 95% pre ambulatiopn. Patient should progress to [facility] home. Patient has Forensic scientist for groceries and [employer]. Pt admitted with above diagnosis. [place] Pager [phone] Office [phone]  Pt currently with functional limitations due to the deficits listed below (see PT Problem List). Pt will benefit from skilled PT to increase their independence and safety with mobility to allow discharge to the venue listed below.       Follow Up Recommendations No PT follow up    Equipment Recommendations  None recommended by PT    Recommendations for Other Services       Precautions / Restrictions Precautions Precautions: None      Mobility  Bed Mobility Overal bed mobility: Independent                Transfers Overall transfer level: Independent               General transfer comment: up ad lib in room  Ambulation/Gait Ambulation/Gait assistance: Supervision;Min guard Gait Distance (Feet): 200 Feet Assistive device: None;1 person hand held assist Gait Pattern/deviations: Drifts right/left;Step-through pattern Gait velocity: decer Gait  velocity interpretation: <1.31 ft/sec, indicative of household ambulator General Gait Details: initially veers, 1 HHA for about 60' then no Assistance  Stairs            Wheelchair Mobility    Modified Rankin (Stroke Patients Only)       Balance Overall balance assessment: Mild deficits observed, not formally tested                                           Pertinent Vitals/Pain Pain Assessment: No/denies pain    Home Living Family/patient expects to be discharged to:: Private residence Living Arrangements: Alone Available Help at Discharge: Available PRN/intermittently(for groceries etc) Type of Home: House Home Access: Stairs to enter   [employer] of Steps: 4 Home Layout: Two level;Able to live on main level with bedroom/bathroom Home Equipment: None      Prior Function Level of Independence: Independent               Hand Dominance        Extremity/Trunk Assessment   Upper Extremity Assessment Upper Extremity Assessment: Generalized weakness    Lower Extremity Assessment Lower Extremity Assessment: Generalized weakness    Cervical / Trunk Assessment Cervical / Trunk Assessment: Normal  Communication   Communication: No difficulties  Cognition   Behavior During Therapy: WFL for tasks assessed/performed Overall Cognitive Status: Within Functional Limits for tasks assessed  General Comments      Exercises General Exercises - Lower Extremity Hip ABduction/ADduction: AROM;Standing;Both Hip Flexion/Marching: AROM;Both;Standing Mini-Sqauts: AROM;Both;Standing Other Exercises Other Exercises: Incentive spirometer x 10 with min cues on technique. Pulling 1556mL Other Exercises: Flutter valve x 10 with min cues on technique. Other Exercises: Pursed lip breathing with min cues on technique.   Assessment/Plan    PT Assessment Patient needs continued PT  services  PT Problem List Decreased strength;Decreased mobility;Decreased activity tolerance       PT Treatment Interventions Gait training;Therapeutic activities;Therapeutic exercise    PT Goals (Current goals can be found in the Care Plan section)  Acute Rehab PT Goals Patient Stated Goal: To go home PT Goal Formulation: With patient Time For Goal Achievement: 09/01/19 Potential to Achieve Goals: Good    Frequency Min 3X/week   Barriers to discharge        Co-evaluation               AM-PAC PT "6 Clicks" Mobility  Outcome Measure Help needed turning from your back to your side while in a flat bed without using bedrails?: None Help needed moving from lying on your back to sitting on the side of a flat bed without using bedrails?: None Help needed moving to and from a bed to a chair (including a wheelchair)?: None Help needed standing up from a chair using your arms (e.g., wheelchair or bedside chair)?: None Help needed to walk in hospital room?: A Little Help needed climbing 3-5 steps with a railing? : A Little 6 Click Score: 22    End of Session   Activity Tolerance: Patient tolerated treatment well Patient left: in bed;with call bell/phone within reach Nurse Communication: Mobility status PT Visit Diagnosis: Difficulty in walking, not elsewhere classified (R26.2);Unsteadiness on feet (R26.81)    Time: [id number] PT Time Calculation (min) (ACUTE ONLY): 17 min   Charges:   PT Evaluation $PT Eval Low Complexity: [place] PT Acute Rehabilitation Services Pager [phone] Office [phone]  Claretha Cooper 08/18/2019, 9:48 AM

## 2019-08-18 NOTE — Progress Notes (Signed)
PROGRESS NOTE                                                                                                                                                                                                             Jorge Gibson Demographics:    Jorge Gibson, is a 58 y.o. male, DOB - 1961-03-26, [id number]  Outpatient Primary MD for the Jorge Gibson is Jorge Gibson, No Pcp Per    LOS - 4  Admit date - 08/14/2019    Chief Complaint  Jorge Gibson presents with  . Shortness of Breath  . Chest Pain  . Cough       Brief Narrative  Jorge Gibson is a 58 year old Caucasian male, overweight, with no documented past medical history.  Jorge Gibson's problem started about 5 days prior to admission with worsening shortness of breath, low-grade fever, cough, chest congestion and weakness.  Jorge Gibson tested positive for Covid.  Chest x-ray revealed left middle lobe and left lower lobe pneumonia.  Jorge Gibson will be transferred to [place] for further assessment and management of pneumonia secondary to severe acute respiratory syndrome Cov-2.   Subjective:   Jorge Gibson in bed, appears comfortable, denies any headache, no fever, no chest pain or pressure, no shortness of breath , no abdominal pain. No focal weakness.   Assessment  & Plan :     1. Acute Hypoxic Resp. Failure due to Acute Covid 19 Viral Pneumonitis during the ongoing 2020 Covid 19 Pandemic - he had moderate disease was started on IV steroids along with remdesivir, currently on room air and symptom-free.  Increase activity taper steroids, finished remdesivir course and discharged home.  Encouraged the Jorge Gibson to sit up in chair in the daytime use I-S and flutter valve for pulmonary toiletry and then prone in bed when at night.   SpO2: 95 %  Recent Labs  Lab 08/14/19 0000 08/14/19 1818 08/15/19 0545 08/15/19 1220 08/15/19 1640 08/16/19 0201 08/17/19 0245 08/18/19 0604  CRP  --   --    --   --  10.0* 10.0* 3.6* 1.2*  DDIMER  --   --   --  0.52*  --  0.56* 0.41 0.37  BNP  --   --  16.1  --   --  20.5 22.2 39.4  PROCALCITON  --   --   --   --   --  <  0.10 <0.10 <0.10  SARSCOV2NAA Detected* POSITIVE*  --   --   --   --   --   --     Hepatic Function Latest Ref Rng & Units 08/18/2019 08/17/2019 08/16/2019  Total Protein 6.5 - 8.1 g/dL 6.4(L) 6.7 7.2  Albumin 3.5 - 5.0 g/dL 3.3(L) 3.3(L) 3.6  AST 15 - 41 U/L '22 21 22  ' ALT 0 - 44 U/L '27 23 22  ' Alk Phosphatase 38 - 126 U/L 64 60 70  Total Bilirubin 0.3 - 1.2 mg/dL 0.8 0.7 0.9      Condition - Fair  Family Communication  : Daughter called on 08/16/2019 on her listed cell phone number at 9:33 AM, message left  Code Status :  Full  Diet :   Diet Order            Diet regular Room service appropriate? Yes; Fluid consistency: Thin  Diet effective now               Disposition Plan  : Home  Consults  :  None  Procedures  :     PUD Prophylaxis :    DVT Prophylaxis  :  Lovenox ordered  Lab Results  Component Value Date   PLT 247 08/18/2019    Inpatient Medications  Scheduled Meds: . dexamethasone (DECADRON) injection  4 mg Intravenous Q24H  . enoxaparin (LOVENOX) injection  40 mg Subcutaneous Daily  . folic acid  1 mg Oral Daily  . thiamine  100 mg Oral Daily   Continuous Infusions: . remdesivir 100 mg in NS 100 mL Stopped (08/17/19 0945)   PRN Meds:.albuterol, guaiFENesin-dextromethorphan  Antibiotics  :    Anti-infectives (From admission, onward)   Start     Dose/Rate Route Frequency Ordered Stop   08/16/19 1000  remdesivir 100 mg in sodium chloride 0.9 % 100 mL IVPB     100 mg 200 mL/hr over 30 Minutes Intravenous Daily 08/15/19 1037 08/20/19 0959   08/15/19 2200  azithromycin (ZITHROMAX) 500 mg in sodium chloride 0.9 % 250 mL IVPB  Status:  Discontinued     500 mg 250 mL/hr over 60 Minutes Intravenous Daily at bedtime 08/14/19 2212 08/15/19 1545   08/15/19 2000  cefTRIAXone (ROCEPHIN) 1 g in  sodium chloride 0.9 % 100 mL IVPB  Status:  Discontinued     1 g 200 mL/hr over 30 Minutes Intravenous Every 24 hours 08/14/19 2212 08/15/19 1545   08/15/19 1200  remdesivir 200 mg in sodium chloride 0.9% 250 mL IVPB     200 mg 580 mL/hr over 30 Minutes Intravenous Once 08/15/19 1037 08/15/19 1404   08/15/19 0006  cefTRIAXone (ROCEPHIN) 1 g in sodium chloride 0.9 % 100 mL IVPB  Status:  Discontinued     1 g 200 mL/hr over 30 Minutes Intravenous Every 24 hours 08/15/19 0007 08/15/19 0022   08/14/19 1945  cefTRIAXone (ROCEPHIN) 1 g in sodium chloride 0.9 % 100 mL IVPB     1 g 200 mL/hr over 30 Minutes Intravenous  Once 08/14/19 1940 08/14/19 2127   08/14/19 1945  azithromycin (ZITHROMAX) 500 mg in sodium chloride 0.9 % 250 mL IVPB     500 mg 250 mL/hr over 60 Minutes Intravenous  Once 08/14/19 1940 08/14/19 2323       Time Spent in minutes  30   Lala Lund M.D on 08/18/2019 at 10:08 AM  To page go to [email or web address] - password [facility]  [facility] -  Office  [phone]  See all Orders from today for further details    Objective:   Vitals:   08/17/19 1700 08/17/19 1943 08/18/19 0408 08/18/19 0834  BP: (!) 145/73 124/75 122/61 127/69  Pulse: 64 70 63 62  Resp:  '19 19 18  ' Temp: 98.2 F (36.8 C) 98.2 F (36.8 C) 98.1 F (36.7 C) 98.2 F (36.8 C)  TempSrc: Oral Oral Oral Oral  SpO2: 95% 92% 92% 95%  Weight:      Height:        Wt Readings from Last 3 Encounters:  08/14/19 90.7 kg     Intake/Output Summary (Last 24 hours) at 08/18/2019 1008 Last data filed at 08/18/2019 0835 Gross per 24 hour  Intake 960 ml  Output -  Net 960 ml     Physical Exam  Awake Alert, No new F.N deficits, Normal affect [place].AT,PERRAL Supple Neck,No JVD, No cervical lymphadenopathy appriciated.  Symmetrical Chest wall movement, Good air movement bilaterally, CTAB RRR,No Gallops, Rubs or new Murmurs, No Parasternal Heave +ve B.Sounds, Abd Soft, No tenderness, No organomegaly  appriciated, No rebound - guarding or rigidity. No Cyanosis, Clubbing or edema, No new Rash or bruise    Data Review:    CBC Recent Labs  Lab 08/14/19 1818 08/15/19 0242 08/16/19 0201 08/17/19 0245 08/18/19 0604  WBC 4.9 4.5 4.3 7.4 7.4  HGB 13.6 12.9* 13.4 13.4 13.1  HCT 40.0 38.6* 40.1 39.9 39.6  PLT 152 151 190 231 247  MCV 89.3 88.9 87.4 87.7 88.4  MCH 30.4 29.7 29.2 29.5 29.2  MCHC 34.0 33.4 33.4 33.6 33.1  RDW 13.1 13.1 13.0 12.7 12.9  LYMPHSABS 1.2  --  0.7 0.8 1.3  MONOABS 0.6  --  0.4 0.7 1.1*  EOSABS 0.0  --  0.0 0.0 0.0  BASOSABS 0.0  --  0.0 0.0 0.0    Chemistries  Recent Labs  Lab 08/14/19 1818 08/15/19 0242 08/15/19 0551 08/16/19 0201 08/17/19 0245 08/18/19 0604  NA 132*  --  136 137 138 139  K 3.6  --  3.5 4.3 4.1 3.7  CL 95*  --  99 96* 99 101  CO2 26  --  '25 25 28 26  ' GLUCOSE 101*  --  103* 118* 142* 103*  BUN 14  --  '15 14 18 17  ' CREATININE 0.82 0.74 0.65 0.79 0.71 0.71  CALCIUM 8.2*  --  8.3* 8.8* 8.5* 8.3*  MG  --   --   --  2.3 2.2 2.3  AST 22  --  '21 22 21 22  ' ALT 26  --  '22 22 23 27  ' ALKPHOS 63  --  62 70 60 64  BILITOT 1.2  --  0.8 0.9 0.7 0.8   ------------------------------------------------------------------------------------------------------------------ No results for input(s): CHOL, HDL, LDLCALC, TRIG, CHOLHDL, LDLDIRECT in the last 72 hours.  No results found for: HGBA1C ------------------------------------------------------------------------------------------------------------------ No results for input(s): TSH, T4TOTAL, T3FREE, THYROIDAB in the last 72 hours.  Invalid input(s): FREET3  Cardiac Enzymes No results for input(s): CKMB, TROPONINI, MYOGLOBIN in the last 168 hours.  Invalid input(s): CK ------------------------------------------------------------------------------------------------------------------    Component Value Date/Time   BNP 39.4 08/18/2019 0604    Micro Results Recent Results (from the past  240 hour(s))  Novel Coronavirus, NAA ([facility])     Status: Abnormal   Collection Time: 08/14/19 12:00 AM   Specimen: Nasopharyngeal(NP) swabs in vial transport medium   NASOPHARYNGE  TESTING  Result Value Ref Range Status   SARS-CoV-2, NAA Detected (A) Not  Detected Final    Comment: Testing was performed using the cobas(R) SARS-CoV-2 test. This nucleic acid amplification test was developed and its performance characteristics determined by [employer]. Nucleic acid amplification tests include PCR and TMA. This test has not been FDA cleared or approved. This test has been authorized by FDA under an Emergency Use Authorization (EUA). This test is only authorized for the duration of time the declaration that circumstances exist justifying the authorization of the emergency use of in vitro diagnostic tests for detection of SARS-CoV-2 virus and/or diagnosis of COVID-19 infection under section 564(b)(1) of the Act, 21 U.S.C. 353GDJ-2(E) (1), unless the authorization is terminated or revoked sooner. When diagnostic testing is negative, the possibility of a false negative result should be considered in the context of a Jorge Gibson's recent exposures and the presence of clinical signs and symptoms consistent with COVID-19. An individual without symptoms  of COVID-19 and who is not shedding SARS-CoV-2 virus would expect to have a negative (not detected) result in this assay.   SARS CORONAVIRUS 2 (TAT 6-24 HRS) Nasopharyngeal Nasopharyngeal Swab     Status: Abnormal   Collection Time: 08/14/19  6:18 PM   Specimen: Nasopharyngeal Swab  Result Value Ref Range Status   SARS Coronavirus 2 POSITIVE (A) NEGATIVE Final    Comment: RESULT CALLED TO, READ BACK BY AND VERIFIED WITH: CEverlean Patterson [id number] 08/15/2019 T. TYSOR (NOTE) SARS-CoV-2 target nucleic acids are DETECTED. The SARS-CoV-2 RNA is generally detectable in upper and lower respiratory specimens during the acute phase of infection.  Positive results are indicative of the presence of SARS-CoV-2 RNA. Clinical correlation with Jorge Gibson history and other diagnostic information is  necessary to determine Jorge Gibson infection status. Positive results do not rule out bacterial infection or co-infection with other viruses.  The expected result is Negative. Fact Sheet for Patients: SugarRoll.be Fact Sheet for Healthcare Providers: https://www.woods-mathews.com/ This test is not yet approved or cleared by the [place] FDA and  has been authorized for detection and/or diagnosis of SARS-CoV-2 by FDA under an Emergency Use Authorization (EUA). This EUA will remain  in effect (meaning this test can be used) f or the duration of the COVID-19 declaration under Section 564(b)(1) of the Act, 21 U.S.C. section 360bbb-3(b)(1), unless the authorization is terminated or revoked sooner. Performed at [facility], [place] [address], [place], [place] [address]   Culture, blood (routine x 2)     Status: None (Preliminary result)   Collection Time: 08/14/19  6:18 PM   Specimen: BLOOD RIGHT HAND  Result Value Ref Range Status   Specimen Description   Final    BLOOD RIGHT HAND Performed at [facility], [place] [address] [place], [place] [address]    Special Requests   Final    BOTTLES DRAWN AEROBIC AND ANAEROBIC Blood Culture adequate volume Performed at [facility], [place] [address], [place], [place] [address]    Culture   Final    NO GROWTH 4 DAYS Performed at [facility], [place] [address] [place], [place] [address]    Report Status PENDING  Incomplete  Culture, blood (routine x 2)     Status: None (Preliminary result)   Collection Time: 08/14/19  6:18 PM   Specimen: BLOOD  Result Value Ref Range Status   Specimen Description   Final    BLOOD RIGHT ANTECUBITAL Performed at [facility], [place] [address], [place], [place] [address]    Special  Requests   Final    BOTTLES DRAWN AEROBIC AND ANAEROBIC Blood Culture adequate volume Performed at [facility], [place] [address], [place], [place] [address]    Culture   Final    NO GROWTH 4 DAYS Performed at [facility], [place] [address] [place], [place] [address]    Report Status PENDING  Incomplete    Radiology Reports Dg Chest Port 1 View  Result Date: 08/14/2019 CLINICAL DATA:  Cough and fever. EXAM: PORTABLE CHEST 1 VIEW COMPARISON:  None FINDINGS: Normal heart size. No pleural effusion identified. Asymmetric airspace consolidation in the left midlung and left base identified compatible with pneumonia. Right lung clear. Visualized osseous structures appear intact. IMPRESSION: 1. Left midlung and left lower lobe pneumonia. 2. Recommend followup imaging to ensure resolution. Electronically Signed   By: Kerby Moors M.D.   On: 08/14/2019 19:29

## 2019-08-19 DIAGNOSIS — U071 COVID-19: Principal | ICD-10-CM

## 2019-08-19 LAB — CBC WITH DIFFERENTIAL/PLATELET
Abs Immature Granulocytes: 0.09 10*3/uL — ABNORMAL HIGH (ref 0.00–0.07)
Basophils Absolute: 0 10*3/uL (ref 0.0–0.1)
Basophils Relative: 0 %
Eosinophils Absolute: 0 10*3/uL (ref 0.0–0.5)
Eosinophils Relative: 0 %
HCT: 39.7 % (ref 39.0–52.0)
Hemoglobin: 13.1 g/dL (ref 13.0–17.0)
Immature Granulocytes: 1 %
Lymphocytes Relative: 19 %
Lymphs Abs: 1.6 10*3/uL (ref 0.7–4.0)
MCH: 29.3 pg (ref 26.0–34.0)
MCHC: 33 g/dL (ref 30.0–36.0)
MCV: 88.8 fL (ref 80.0–100.0)
Monocytes Absolute: 1.2 10*3/uL — ABNORMAL HIGH (ref 0.1–1.0)
Monocytes Relative: 15 %
Neutro Abs: 5.4 10*3/uL (ref 1.7–7.7)
Neutrophils Relative %: 65 %
Platelets: 251 10*3/uL (ref 150–400)
RBC: 4.47 MIL/uL (ref 4.22–5.81)
RDW: 12.9 % (ref 11.5–15.5)
WBC: 8.4 10*3/uL (ref 4.0–10.5)
nRBC: 0 % (ref 0.0–0.2)

## 2019-08-19 LAB — COMPREHENSIVE METABOLIC PANEL
ALT: 39 U/L (ref 0–44)
AST: 23 U/L (ref 15–41)
Albumin: 3.4 g/dL — ABNORMAL LOW (ref 3.5–5.0)
Alkaline Phosphatase: 69 U/L (ref 38–126)
Anion gap: 7 (ref 5–15)
BUN: 16 mg/dL (ref 6–20)
CO2: 29 mmol/L (ref 22–32)
Calcium: 8.4 mg/dL — ABNORMAL LOW (ref 8.9–10.3)
Chloride: 101 mmol/L (ref 98–111)
Creatinine, Ser: 0.75 mg/dL (ref 0.61–1.24)
GFR calc Af Amer: >60 mL/min (ref >60)
GFR calc non Af Amer: >60 mL/min (ref >60)
Glucose, Bld: 101 mg/dL — ABNORMAL HIGH (ref 70–99)
Potassium: 3.9 mmol/L (ref 3.5–5.1)
Sodium: 137 mmol/L (ref 135–145)
Total Bilirubin: 0.8 mg/dL (ref 0.3–1.2)
Total Protein: 6.4 g/dL — ABNORMAL LOW (ref 6.5–8.1)

## 2019-08-19 LAB — CULTURE, BLOOD (ROUTINE X 2)
Culture: NO GROWTH
Culture: NO GROWTH
Special Requests: ADEQUATE
Special Requests: ADEQUATE

## 2019-08-19 LAB — C-REACTIVE PROTEIN: CRP: 3 mg/dL — ABNORMAL HIGH (ref <1.0)

## 2019-08-19 LAB — D-DIMER, QUANTITATIVE: D-Dimer, Quant: 0.36 ug/mL-FEU (ref 0.00–0.50)

## 2019-08-19 MED ORDER — METHYLPREDNISOLONE 4 MG PO TBPK: ORAL_TABLET | ORAL | 0 refills | Status: DC

## 2019-08-19 MED ORDER — ALBUTEROL SULFATE HFA 108 (90 BASE) MCG/ACT IN AERS: 2.0000 | INHALATION_SPRAY | Freq: Four times a day (QID) | RESPIRATORY_TRACT | 0 refills | Status: DC | PRN

## 2019-08-19 NOTE — Discharge Summary (Signed)
[name] QTM:[id number] DOB: [birth year] DOA: 08/14/2019  PCP: Jorge Gibson  Admit date: 08/14/2019  Discharge date: 08/19/2019  Admitted From: Home   Disposition:  Home   Recommendations for Outpatient Follow-up:   Follow up with PCP in 1-2 weeks  PCP Please obtain BMP/CBC, 2 view CXR in 1week,  (see Discharge instructions)   PCP Please follow up on the following pending results:    Home Health: None   Equipment/Devices: None  Consultations: None  Discharge Condition: Stable    CODE STATUS: Full    Diet Recommendation: Heart Healthy      Chief Complaint  Patient presents with   Shortness of Breath   Chest Pain   Cough     Brief history of present illness from the day of admission and additional interim summary    Patient is a 58 year old Caucasian male, overweight, with no documented past medical history. Patient's problem started about 5 days prior to admission with worsening shortness of breath, low-grade fever, cough, chest congestion and weakness. Patient tested positive for Covid. Chest x-ray revealed left middle lobe and left lower lobe pneumonia. Patient will be transferred to [place] for further assessment and management of pneumonia secondary to severe acute respiratory syndrome Cov-2.                                                                 Hospital Course    1. Acute Hypoxic Resp. Failure due to Acute Covid 19 Viral Pneumonitis during the ongoing 2020 Covid 19 Pandemic - he had moderate disease was started on IV steroids along with remdesivir, now has been symptom free on room air for 2 days. He has finished remdesivir course and will be discharged home with steroid taper and PCP follow up.  COVID-19 Labs  Recent Labs    08/17/19 0245 08/18/19 0604  08/19/19 0445  DDIMER 0.41 0.37 0.36  CRP 3.6* 1.2* 3.0*    Lab Results  Component Value Date   SARSCOV2NAA POSITIVE (A) 08/14/2019   SARSCOV2NAA Detected (A) 08/14/2019    Hepatic Function Latest Ref Rng & Units 08/19/2019 08/18/2019 08/17/2019  Total Protein 6.5 - 8.1 g/dL 6.4(L) 6.4(L) 6.7  Albumin 3.5 - 5.0 g/dL 3.4(L) 3.3(L) 3.3(L)  AST 15 - 41 U/L '23 22 21  ' ALT 0 - 44 U/L 39 27 23  Alk Phosphatase 38 - 126 U/L 69 64 60  Total Bilirubin 0.3 - 1.2 mg/dL 0.8 0.8 0.7      Discharge diagnosis     Active Problems:   CAP (community acquired pneumonia)    Discharge instructions    Discharge Instructions    Diet - low sodium heart healthy   Complete by: As directed    Discharge instructions   Complete by: As directed  Follow with Primary MD Jorge Gibson in 7 days   Get CBC, CMP, 2 view Chest X ray -  checked next visit within 1 week by Primary MD    Activity: As tolerated with Full fall precautions use walker/cane & assistance as needed  Disposition Home   Diet: Heart Healthy   Special Instructions: If you have smoked or chewed Tobacco  in the last 2 yrs please stop smoking, stop any regular Alcohol  and or any Recreational drug use.  On your next visit with your primary care physician please Get Medicines reviewed and adjusted.  Please request your Prim.MD to go over all Hospital Tests and Procedure/Radiological results at the follow up, please get all Hospital records sent to your Prim MD by signing hospital release before you go home.  If you experience worsening of your admission symptoms, develop shortness of breath, life threatening emergency, suicidal or homicidal thoughts you must seek medical attention immediately by calling 911 or calling your MD immediately  if symptoms less severe.  You Must read complete instructions/literature along with all the possible adverse reactions/side effects for all the Medicines you take and that have been prescribed  to you. Take any new Medicines after you have completely understood and accpet all the possible adverse reactions/side effects.   Increase activity slowly   Complete by: As directed       Discharge Medications   Allergies as of 08/19/2019   No Known Allergies     Medication List    TAKE these medications   acetaminophen 325 MG tablet Commonly known as: TYLENOL Take 650 mg by mouth every 6 (six) hours as needed for mild pain or headache.   albuterol 108 (90 Base) MCG/ACT inhaler Commonly known as: VENTOLIN HFA Inhale 2 puffs into the lungs every 6 (six) hours as needed for wheezing or shortness of breath.   methylPREDNISolone 4 MG Tbpk tablet Commonly known as: MEDROL DOSEPAK follow package directions       Follow-up Information    PCP. Schedule an appointment as soon as possible for a visit in 1 week(s).           Major procedures and Radiology Reports - PLEASE review detailed and final reports thoroughly  -        Dg Chest Port 1 View  Result Date: 08/14/2019 CLINICAL DATA:  Cough and fever. EXAM: PORTABLE CHEST 1 VIEW COMPARISON:  None FINDINGS: Normal heart size. No pleural effusion identified. Asymmetric airspace consolidation in the left midlung and left base identified compatible with pneumonia. Right lung clear. Visualized osseous structures appear intact. IMPRESSION: 1. Left midlung and left lower lobe pneumonia. 2. Recommend followup imaging to ensure resolution. Electronically Signed   By: Kerby Moors M.D.   On: 08/14/2019 19:29    Micro Results     Recent Results (from the past 240 hour(s))  Novel Coronavirus, NAA ([facility])     Status: Abnormal   Collection Time: 08/14/19 12:00 AM   Specimen: Nasopharyngeal(NP) swabs in vial transport medium   NASOPHARYNGE  TESTING  Result Value Ref Range Status   SARS-CoV-2, NAA Detected (A) Not Detected Final    Comment: Testing was performed using the cobas(R) SARS-CoV-2 test. This nucleic acid amplification  test was developed and its performance characteristics determined by [employer]. Nucleic acid amplification tests include PCR and TMA. This test has not been FDA cleared or approved. This test has been authorized by FDA under an Emergency Use Authorization (EUA). This test  is only authorized for the duration of time the declaration that circumstances exist justifying the authorization of the emergency use of in vitro diagnostic tests for detection of SARS-CoV-2 virus and/or diagnosis of COVID-19 infection under section 564(b)(1) of the Act, 21 U.S.C. 161WRU-0(A) (1), unless the authorization is terminated or revoked sooner. When diagnostic testing is negative, the possibility of a false negative result should be considered in the context of a patient's recent exposures and the presence of clinical signs and symptoms consistent with COVID-19. An individual without symptoms  of COVID-19 and who is not shedding SARS-CoV-2 virus would expect to have a negative (not detected) result in this assay.   SARS CORONAVIRUS 2 (TAT 6-24 HRS) Nasopharyngeal Nasopharyngeal Swab     Status: Abnormal   Collection Time: 08/14/19  6:18 PM   Specimen: Nasopharyngeal Swab  Result Value Ref Range Status   SARS Coronavirus 2 POSITIVE (A) NEGATIVE Final    Comment: RESULT CALLED TO, READ BACK BY AND VERIFIED WITH: CEverlean Patterson [id number] 08/15/2019 T. TYSOR (NOTE) SARS-CoV-2 target nucleic acids are DETECTED. The SARS-CoV-2 RNA is generally detectable in upper and lower respiratory specimens during the acute phase of infection. Positive results are indicative of the presence of SARS-CoV-2 RNA. Clinical correlation with patient history and other diagnostic information is  necessary to determine patient infection status. Positive results do not rule out bacterial infection or co-infection with other viruses.  The expected result is Negative. Fact Sheet for  Patients: SugarRoll.be Fact Sheet for Healthcare Providers: https://www.woods-mathews.com/ This test is not yet approved or cleared by the [place] FDA and  has been authorized for detection and/or diagnosis of SARS-CoV-2 by FDA under an Emergency Use Authorization (EUA). This EUA will remain  in effect (meaning this test can be used) f or the duration of the COVID-19 declaration under Section 564(b)(1) of the Act, 21 U.S.C. section 360bbb-3(b)(1), unless the authorization is terminated or revoked sooner. Performed at [facility], [place] [address] [place], [place] [address]   Culture, blood (routine x 2)     Status: None (Preliminary result)   Collection Time: 08/14/19  6:18 PM   Specimen: BLOOD RIGHT HAND  Result Value Ref Range Status   Specimen Description   Final    BLOOD RIGHT HAND Performed at [facility], [place] [address], [place], [place] [address]    Special Requests   Final    BOTTLES DRAWN AEROBIC AND ANAEROBIC Blood Culture adequate volume Performed at [facility], [place] [address], [place], [place] [address]    Culture   Final    NO GROWTH 4 DAYS Performed at [facility], [place] [address] [place], [place] [address]    Report Status PENDING  Incomplete  Culture, blood (routine x 2)     Status: None (Preliminary result)   Collection Time: 08/14/19  6:18 PM   Specimen: BLOOD  Result Value Ref Range Status   Specimen Description   Final    BLOOD RIGHT ANTECUBITAL Performed at [facility], [place] [address] [place], [place] [address]    Special Requests   Final    BOTTLES DRAWN AEROBIC AND ANAEROBIC Blood Culture adequate volume Performed at [facility], [place] [address] [place], [place] [address]    Culture   Final    NO GROWTH 4 DAYS Performed at [facility], [place] [address] [place], [place] [address]     Report  Status PENDING  Incomplete    Today   Subjective    Jorge Gibson today has no headache,no chest abdominal pain,no new weakness tingling or numbness, feels much better wants to go home today.     Objective   Blood pressure 117/67, pulse 65, temperature 98 F (36.7 C), temperature source Oral, resp. rate 18, height '5\' 11"'  (1.803 m), weight 90.7 kg, SpO2 95 %.   Intake/Output Summary (Last 24 hours) at 08/19/2019 0922 Last data filed at 08/19/2019 0744 Gross Gibson 24 hour  Intake 60 ml  Output --  Net 60 ml    Exam Awake Alert, Oriented x 3, No new F.N deficits, Normal affect [place].AT,PERRAL Supple Neck,No JVD, No cervical lymphadenopathy appriciated.  Symmetrical Chest wall movement, Good air movement bilaterally, CTAB RRR,No Gallops,Rubs or new Murmurs, No Parasternal Heave +ve B.Sounds, Abd Soft, Non tender, No organomegaly appriciated, No rebound -guarding or rigidity. No Cyanosis, Clubbing or edema, No new Rash or bruise   Data Review   CBC w Diff:  Lab Results  Component Value Date   WBC 8.4 08/19/2019   HGB 13.1 08/19/2019   HCT 39.7 08/19/2019   PLT 251 08/19/2019   LYMPHOPCT 19 08/19/2019   MONOPCT 15 08/19/2019   EOSPCT 0 08/19/2019   BASOPCT 0 08/19/2019    CMP:  Lab Results  Component Value Date   NA 137 08/19/2019   K 3.9 08/19/2019   CL 101 08/19/2019   CO2 29 08/19/2019   BUN 16 08/19/2019   CREATININE 0.75 08/19/2019   PROT 6.4 (L) 08/19/2019   ALBUMIN 3.4 (L) 08/19/2019   BILITOT 0.8 08/19/2019   ALKPHOS 69 08/19/2019   AST 23 08/19/2019   ALT 39 08/19/2019  .   Total Time in preparing paper work, data evaluation and todays exam - 82 minutes  Jorge Gibson M.D on 08/19/2019 at [place]  [phone]

## 2019-08-19 NOTE — Discharge Instructions (Signed)
Follow with Primary MD Patient, No Pcp Per in 7 days   Get CBC, CMP, 2 view Chest X ray -  checked next visit within 1 week by Primary MD    Activity: As tolerated with Full fall precautions use walker/cane & assistance as needed  Disposition Home   Diet: Heart Healthy   Special Instructions: If you have smoked or chewed Tobacco  in the last 2 yrs please stop smoking, stop any regular Alcohol  and or any Recreational drug use.  On your next visit with your primary care physician please Get Medicines reviewed and adjusted.  Please request your Prim.MD to go over all Hospital Tests and Procedure/Radiological results at the follow up, please get all Hospital records sent to your Prim MD by signing hospital release before you go home.  If you experience worsening of your admission symptoms, develop shortness of breath, life threatening emergency, suicidal or homicidal thoughts you must seek medical attention immediately by calling 911 or calling your MD immediately  if symptoms less severe.  You Must read complete instructions/literature along with all the possible adverse reactions/side effects for all the Medicines you take and that have been prescribed to you. Take any new Medicines after you have completely understood and accpet all the possible adverse reactions/side effects.       Person Under Monitoring Name: Jorge Gibson  Location: [place] [address]   Infection Prevention Recommendations for Individuals Confirmed to have, or Being Evaluated for, 2019 Novel Coronavirus (COVID-19) Infection Who Receive Care at Home  Individuals who are confirmed to have, or are being evaluated for, COVID-19 should follow the prevention steps below until a healthcare provider or local or state health department says they can return to normal activities.  Stay home except to get medical care You should restrict activities outside your home, except for getting medical care.  Do not go to work, school, or public areas, and do not use public transportation or taxis.  Call ahead before visiting your doctor Before your medical appointment, call the healthcare provider and tell them that you have, or are being evaluated for, COVID-19 infection. This will help the healthcare providers office take steps to keep other people from getting infected. Ask your healthcare provider to call the local or state health department.  Monitor your symptoms Seek prompt medical attention if your illness is worsening (e.g., difficulty breathing). Before going to your medical appointment, call the healthcare provider and tell them that you have, or are being evaluated for, COVID-19 infection. Ask your healthcare provider to call the local or state health department.  Wear a facemask You should wear a facemask that covers your nose and mouth when you are in the same room with other people and when you visit a healthcare provider. People who live with or visit you should also wear a facemask while they are in the same room with you.  Separate yourself from other people in your home As much as possible, you should stay in a different room from other people in your home. Also, you should use a separate bathroom, if available.  Avoid sharing household items You should not share dishes, drinking glasses, cups, eating utensils, towels, bedding, or other items with other people in your home. After using these items, you should wash them thoroughly with soap and water.  Cover your coughs and sneezes Cover your mouth and nose with a tissue when you cough or sneeze, or you can cough or sneeze  into your sleeve. Throw used tissues in a lined trash can, and immediately wash your hands with soap and water for at least 20 seconds or use an alcohol-based hand rub.  Wash your [facility] your hands often and thoroughly with soap and water for at least 20 seconds. You can use an alcohol-based  hand sanitizer if soap and water are not available and if your hands are not visibly dirty. Avoid touching your eyes, nose, and mouth with unwashed hands.   Prevention Steps for Caregivers and Household Members of Individuals Confirmed to have, or Being Evaluated for, COVID-19 Infection Being Cared for in the Home  If you live with, or provide care at home for, a person confirmed to have, or being evaluated for, COVID-19 infection please follow these guidelines to prevent infection:  Follow healthcare providers instructions Make sure that you understand and can help the patient follow any healthcare provider instructions for all care.  Provide for the patients basic needs You should help the patient with basic needs in the home and provide support for getting groceries, prescriptions, and other personal needs.  Monitor the patients symptoms If they are getting sicker, call his or her medical provider and tell them that the patient has, or is being evaluated for, COVID-19 infection. This will help the healthcare providers office take steps to keep other people from getting infected. Ask the healthcare provider to call the local or state health department.  Limit the number of people who have contact with the patient  If possible, have only one caregiver for the patient.  Other household members should stay in another home or place of residence. If this is not possible, they should stay  in another room, or be separated from the patient as much as possible. Use a separate bathroom, if available.  Restrict visitors who do not have an essential need to be in the home.  Keep older adults, very young children, and other sick people away from the patient Keep older adults, very young children, and those who have compromised immune systems or chronic health conditions away from the patient. This includes people with chronic heart, lung, or kidney conditions, diabetes, and  cancer.  Ensure good ventilation Make sure that shared spaces in the home have good air flow, such as from an air conditioner or an opened window, weather permitting.  Wash your hands often  Wash your hands often and thoroughly with soap and water for at least 20 seconds. You can use an alcohol based hand sanitizer if soap and water are not available and if your hands are not visibly dirty.  Avoid touching your eyes, nose, and mouth with unwashed hands.  Use disposable paper towels to dry your hands. If not available, use dedicated cloth towels and replace them when they become wet.  Wear a facemask and gloves  Wear a disposable facemask at all times in the room and gloves when you touch or have contact with the patients blood, body fluids, and/or secretions or excretions, such as sweat, saliva, sputum, nasal mucus, vomit, urine, or feces.  Ensure the mask fits over your nose and mouth tightly, and do not touch it during use.  Throw out disposable facemasks and gloves after using them. Do not reuse.  Wash your hands immediately after removing your facemask and gloves.  If your personal clothing becomes contaminated, carefully remove clothing and launder. Wash your hands after handling contaminated clothing.  Place all used disposable facemasks, gloves, and other waste in  a lined container before disposing them with other household waste.  Remove gloves and wash your hands immediately after handling these items.  Do not share dishes, glasses, or other household items with the patient  Avoid sharing household items. You should not share dishes, drinking glasses, cups, eating utensils, towels, bedding, or other items with a patient who is confirmed to have, or being evaluated for, COVID-19 infection.  After the person uses these items, you should wash them thoroughly with soap and water.  Wash laundry thoroughly  Immediately remove and wash clothes or bedding that have blood, body  fluids, and/or secretions or excretions, such as sweat, saliva, sputum, nasal mucus, vomit, urine, or feces, on them.  Wear gloves when handling laundry from the patient.  Read and follow directions on labels of laundry or clothing items and detergent. In general, wash and dry with the warmest temperatures recommended on the label.  Clean all areas the individual has used often  Clean all touchable surfaces, such as counters, tabletops, doorknobs, bathroom fixtures, toilets, phones, keyboards, tablets, and bedside tables, every day. Also, clean any surfaces that may have blood, body fluids, and/or secretions or excretions on them.  Wear gloves when cleaning surfaces the patient has come in contact with.  Use a diluted bleach solution (e.g., dilute bleach with 1 part bleach and 10 parts water) or a household disinfectant with a label that says EPA-registered for coronaviruses. To make a bleach solution at home, add 1 tablespoon of bleach to 1 quart (4 cups) of water. For a larger supply, add  cup of bleach to 1 gallon (16 cups) of water.  Read labels of cleaning products and follow recommendations provided on product labels. Labels contain instructions for safe and effective use of the cleaning product including precautions you should take when applying the product, such as wearing gloves or eye protection and making sure you have good ventilation during use of the product.  Remove gloves and wash hands immediately after cleaning.  Monitor yourself for signs and symptoms of illness Caregivers and household members are considered close contacts, should monitor their health, and will be asked to limit movement outside of the home to the extent possible. Follow the monitoring steps for close contacts listed on the symptom monitoring form.   ? If you have additional questions, contact your local health department or call the epidemiologist on call at [phone] (available 24/7). ? This  guidance is subject to change. For the most up-to-date guidance from [facility], please refer to their website: YouBlogs.pl

## 2019-08-19 NOTE — Progress Notes (Signed)
Reviewed AVS with patient. All questions answered.

## 2019-11-18 DIAGNOSIS — Z947 Corneal transplant status: Secondary | ICD-10-CM | POA: Diagnosis not present

## 2019-12-02 DIAGNOSIS — H18519 Endothelial corneal dystrophy, unspecified eye: Secondary | ICD-10-CM | POA: Diagnosis not present

## 2019-12-02 DIAGNOSIS — H2512 Age-related nuclear cataract, left eye: Secondary | ICD-10-CM | POA: Diagnosis not present

## 2019-12-02 DIAGNOSIS — D3131 Benign neoplasm of right choroid: Secondary | ICD-10-CM | POA: Diagnosis not present

## 2019-12-02 DIAGNOSIS — H35721 Serous detachment of retinal pigment epithelium, right eye: Secondary | ICD-10-CM | POA: Diagnosis not present

## 2020-01-11 DIAGNOSIS — H3589 Other specified retinal disorders: Secondary | ICD-10-CM | POA: Diagnosis not present

## 2020-01-11 DIAGNOSIS — D3131 Benign neoplasm of right choroid: Secondary | ICD-10-CM | POA: Diagnosis not present

## 2020-01-11 DIAGNOSIS — H35711 Central serous chorioretinopathy, right eye: Secondary | ICD-10-CM | POA: Diagnosis not present

## 2020-01-11 DIAGNOSIS — Z961 Presence of intraocular lens: Secondary | ICD-10-CM | POA: Diagnosis not present

## 2020-01-11 DIAGNOSIS — H2513 Age-related nuclear cataract, bilateral: Secondary | ICD-10-CM | POA: Diagnosis not present

## 2020-01-11 DIAGNOSIS — H2512 Age-related nuclear cataract, left eye: Secondary | ICD-10-CM | POA: Diagnosis not present

## 2020-01-11 DIAGNOSIS — Z9889 Other specified postprocedural states: Secondary | ICD-10-CM | POA: Diagnosis not present

## 2020-03-15 DIAGNOSIS — H18512 Endothelial corneal dystrophy, left eye: Secondary | ICD-10-CM | POA: Diagnosis not present

## 2020-03-15 DIAGNOSIS — H25812 Combined forms of age-related cataract, left eye: Secondary | ICD-10-CM | POA: Diagnosis not present

## 2020-03-30 DIAGNOSIS — H35711 Central serous chorioretinopathy, right eye: Secondary | ICD-10-CM | POA: Diagnosis not present

## 2020-04-13 DIAGNOSIS — Z961 Presence of intraocular lens: Secondary | ICD-10-CM | POA: Diagnosis not present

## 2020-04-13 DIAGNOSIS — H35711 Central serous chorioretinopathy, right eye: Secondary | ICD-10-CM | POA: Diagnosis not present

## 2020-04-13 DIAGNOSIS — Z9889 Other specified postprocedural states: Secondary | ICD-10-CM | POA: Diagnosis not present

## 2020-04-13 DIAGNOSIS — D3131 Benign neoplasm of right choroid: Secondary | ICD-10-CM | POA: Diagnosis not present

## 2020-09-12 ENCOUNTER — Other Ambulatory Visit: Payer: Self-pay

## 2020-09-12 DIAGNOSIS — Z20822 Contact with and (suspected) exposure to covid-19: Secondary | ICD-10-CM

## 2020-09-13 LAB — SARS-COV-2, NAA 2 DAY TAT

## 2020-09-13 LAB — NOVEL CORONAVIRUS, NAA: SARS-CoV-2, NAA: NOT DETECTED

## 2020-10-24 DIAGNOSIS — D3131 Benign neoplasm of right choroid: Secondary | ICD-10-CM | POA: Diagnosis not present

## 2020-10-24 DIAGNOSIS — H18513 Endothelial corneal dystrophy, bilateral: Secondary | ICD-10-CM | POA: Diagnosis not present

## 2020-10-24 DIAGNOSIS — H35711 Central serous chorioretinopathy, right eye: Secondary | ICD-10-CM | POA: Diagnosis not present

## 2020-11-07 DIAGNOSIS — Z947 Corneal transplant status: Secondary | ICD-10-CM | POA: Diagnosis not present

## 2020-11-07 DIAGNOSIS — H35711 Central serous chorioretinopathy, right eye: Secondary | ICD-10-CM | POA: Diagnosis not present

## 2020-12-11 IMAGING — DX DG CHEST 1V PORT
1 series · 1 of 1 positions shown · non-contrast
Comparison: None

CLINICAL DATA: Cough and fever.

EXAM:
PORTABLE CHEST 1 VIEW

[chest ap]
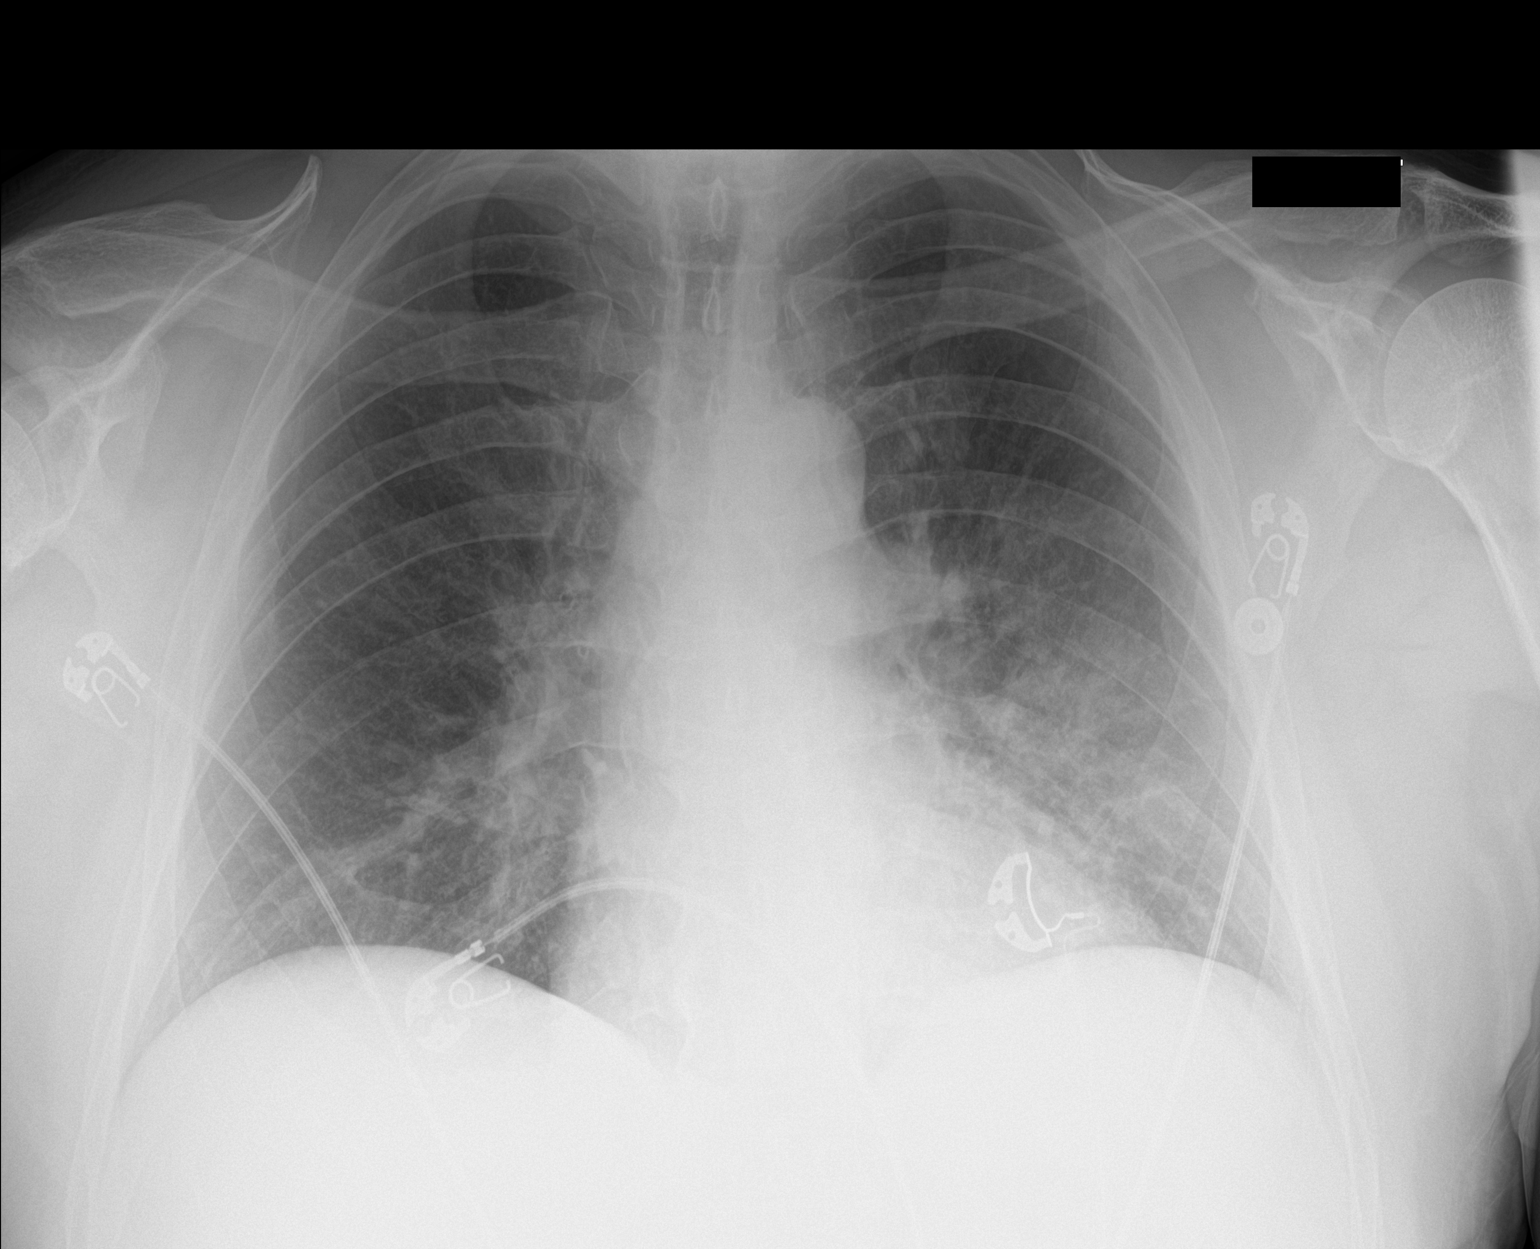

[1 of 1 positions shown; findings below may reference images not displayed]

FINDINGS: Normal heart size. No pleural effusion identified. Asymmetric
airspace consolidation in the left midlung and left base identified
compatible with pneumonia. Right lung clear. Visualized osseous
structures appear intact.
IMPRESSION: 1. Left midlung and left lower lobe pneumonia.
2. Recommend followup imaging to ensure resolution.

## 2021-04-24 DIAGNOSIS — Z947 Corneal transplant status: Secondary | ICD-10-CM | POA: Diagnosis not present

## 2021-04-24 DIAGNOSIS — H35711 Central serous chorioretinopathy, right eye: Secondary | ICD-10-CM | POA: Diagnosis not present

## 2021-04-24 DIAGNOSIS — H5319 Other subjective visual disturbances: Secondary | ICD-10-CM | POA: Diagnosis not present

## 2021-04-24 DIAGNOSIS — H26491 Other secondary cataract, right eye: Secondary | ICD-10-CM | POA: Diagnosis not present

## 2021-04-24 DIAGNOSIS — D3131 Benign neoplasm of right choroid: Secondary | ICD-10-CM | POA: Diagnosis not present

## 2021-04-24 DIAGNOSIS — H18512 Endothelial corneal dystrophy, left eye: Secondary | ICD-10-CM | POA: Diagnosis not present

## 2021-04-24 DIAGNOSIS — Z961 Presence of intraocular lens: Secondary | ICD-10-CM | POA: Diagnosis not present

## 2021-06-05 DIAGNOSIS — Z947 Corneal transplant status: Secondary | ICD-10-CM | POA: Diagnosis not present

## 2021-10-03 DIAGNOSIS — M7711 Lateral epicondylitis, right elbow: Secondary | ICD-10-CM | POA: Diagnosis not present

## 2021-10-09 ENCOUNTER — Other Ambulatory Visit: Payer: Self-pay | Admitting: Internal Medicine

## 2021-10-09 DIAGNOSIS — Z8249 Family history of ischemic heart disease and other diseases of the circulatory system: Secondary | ICD-10-CM

## 2021-10-30 DIAGNOSIS — D3131 Benign neoplasm of right choroid: Secondary | ICD-10-CM | POA: Diagnosis not present

## 2021-10-30 DIAGNOSIS — H35711 Central serous chorioretinopathy, right eye: Secondary | ICD-10-CM | POA: Diagnosis not present

## 2021-10-30 DIAGNOSIS — H35361 Drusen (degenerative) of macula, right eye: Secondary | ICD-10-CM | POA: Diagnosis not present

## 2021-10-30 DIAGNOSIS — H18513 Endothelial corneal dystrophy, bilateral: Secondary | ICD-10-CM | POA: Diagnosis not present

## 2021-11-07 ENCOUNTER — Ambulatory Visit
Admission: RE | Admit: 2021-11-07 | Discharge: 2021-11-07 | Disposition: A | Discharge status: Home / Self Care | Payer: No Typology Code available for payment source | Source: Ambulatory Visit | Attending: Internal Medicine | Admitting: Internal Medicine

## 2021-11-07 DIAGNOSIS — Z8249 Family history of ischemic heart disease and other diseases of the circulatory system: Secondary | ICD-10-CM

## 2021-12-02 NOTE — Progress Notes (Signed)
?Cardiology Office Note:   ? ?Date:  12/04/2021  ? ?ID:  Jorge Gibson, DOB 1961/04/30, MRN [id number] ? ?PCP:  Patient, No Pcp Per (Inactive)  ? ?[facility] Providers ?Cardiologist:  Lenna Sciara, MD ?Referring MD: Ginger Organ., MD  ? ?Chief Complaint/Reason for Referral: Elevated coronary calcium score ? ?ASSESSMENT:   ? ?1. Coronary artery calcification seen on CAT scan   ?2. Aortic atherosclerosis ([place])   ?3. Hyperlipidemia, unspecified hyperlipidemia type   ?4. BMI 31.0-31.9,adult   ? ? ?PLAN:   ? ?In order of problems listed above: ?1.  Continue aspirin and Crestor.  We will obtain an echocardiogram and exercise treadmill stress test to evaluate further.  If this is positive may start Imdur and beta-blockade.  Follow-up in 6 months or earlier if needed. ?2.  Continue aspirin and Crestor with strict blood pressure control ?3.  Continue Crestor with goal LDL less than 70.  We will check LP(a) which would inform need for lower LDL goal. ?4.  Diet and exercise for now. ? ? ?     ? ?Shared Decision Making/Informed Consent ?The risks [chest pain, shortness of breath, cardiac arrhythmias, dizziness, blood pressure fluctuations, myocardial infarction, stroke/transient ischemic attack, and life-threatening complications (estimated to be 1 in 10,000)], benefits (risk stratification, diagnosing coronary artery disease, treatment guidance) and alternatives of an exercise tolerance test were discussed in detail with Jorge Gibson and he agrees to proceed.  ? ?Dispo:  Return in about 6 months (around 06/06/2022).  ? ?  ? ?Medication Adjustments/Labs and Tests Ordered: ?Current medicines are reviewed at length with the patient today.  Concerns regarding medicines are outlined above.  ? ?Tests Ordered: ?Orders Placed This Encounter  ?Procedures  ? Exercise Tolerance Test  ? EKG 12-Lead  ? ECHOCARDIOGRAM COMPLETE  ? ? ?Medication Changes: ?No orders of the defined types were placed in this encounter. ? ? ?History of  Present Illness:   ? ?FOCUSED PROBLEM LIST:   ?1.  Elevated calcium score and atherosclerotic aorta on CT 2023 ?2.  BMI of 31 ?3.  COVID infection 2020 ? ?The patient is a 61 y.o. male with the indicated medical history here for recommendations regarding elevated calcium score.  The patient was referred for calcium score for screening purposes.  He is here to see cardiology for further recommendations.  He was started appropriately on aspirin and rosuvastatin by his PCP. ? ?He gets chest pain when he lays down to sleep.  It is associated with occasional very rare palpitations.  He does not really get exertional chest pain.  He may get that maybe once every month.  He denies any significant shortness of breath.  He has had orthostatic symptoms since contracting COVID.  He denies any orthopnea, peripheral edema, signs or symptoms stroke, or severe bleeding.  He he does not smoke and he works in [employer], technical sales.  He has lost about 20 pounds through diet and exercise. ?   ? ?Current Medications: ?Current Meds  ?Medication Sig  ? aspirin 81 MG chewable tablet Chew 81 mg by mouth daily.  ? Chlorpheniramine Maleate (ALLERGY RELIEF PO) Take 10 mg by mouth daily.  ? rosuvastatin (CRESTOR) 20 MG tablet Take 20 mg by mouth daily.  ?  ? ?Allergies:    ?Patient has no known allergies.  ? ?Social History:   ?Social History  ? ?Tobacco Use  ? Smoking status: Never  ? Smokeless tobacco: Never  ?Substance Use Topics  ? Alcohol use: Yes  ?  Comment: social  ?  ? ?Family Hx: ?Family History  ?Problem Relation Age of Onset  ? Cancer Mother   ? Cancer Father   ? Healthy Sister   ? Healthy Brother   ? Cancer Paternal Grandfather   ?  ? ?Review of Systems:   ?Please see the history of present illness.    ?All other systems reviewed and are negative. ?  ? ? ?EKGs/Labs/Other Test Reviewed:   ? ?EKG: 2020 sinus rhythm; EKG today demonstrates sinus rhythm with possible anterior septal MI pattern. ? ?Prior CV studies: ? ?CT 2023 ?Left Main: 98.2 ?LAD:  390 ?LCx: 51 ?RCA: 8295 ?Total Agatston Score: 1706 ? ?1. Calculated coronary calcium score is 1706 and this is at ?percentile 98 for patients of the same age, gender and ethnicity. ?Please note that the calcium score in the right coronary artery is ?likely artificially elevated due to over sampling from the motion ?artifact. ?2.  Aortic Atherosclerosis (ICD10-I70.0). ? ?Imaging studies that I have independently reviewed today: CT ? ?Recent Labs: ?No results found for requested labs within last 8760 hours.  ? ?Recent Lipid Panel ?No results found for: CHOL, TRIG, HDL, LDLCALC, LDLDIRECT ? ?Risk Assessment/Calculations:   ? ? ?    ? ?Physical Exam:   ? ?VS:  BP 130/78   Pulse 65   Ht '5\' 11"'$  (1.803 m)   Wt 221 lb (100.2 kg) Comment: 221lb  SpO2 96%   BMI 30.82 kg/m?    ?Wt Readings from Last 3 Encounters:  ?12/04/21 221 lb (100.2 kg)  ?08/14/19 200 lb (90.7 kg)  ?  ?GENERAL:  No apparent distress, AOx3 ?HEENT:  No carotid bruits, +2 carotid impulses, no scleral icterus ?CAR: RRR no murmurs, gallops, rubs, or thrills ?RES:  Clear to auscultation bilaterally ?ABD:  Soft, nontender, nondistended, positive bowel sounds x 4 ?VASC:  +2 radial pulses, +2 carotid pulses, palpable pedal pulses ?NEURO:  CN 2-12 grossly intact; motor and sensory grossly intact ?PSYCH:  No active depression or anxiety ?EXT:  No edema, ecchymosis, or cyanosis ? ?Signed, ?Early Osmond, MD  ?12/04/2021 10:40 AM    ?[facility] ?[address], [place], [place]  [address] ?Phone: [phone]; Fax: [phone]  ? ?Note:  This document was prepared using Dragon voice recognition software and may include unintentional dictation errors. ?

## 2021-12-04 ENCOUNTER — Ambulatory Visit: Payer: BC Managed Care – PPO | Admitting: Internal Medicine

## 2021-12-04 ENCOUNTER — Encounter: Payer: Self-pay | Admitting: Internal Medicine

## 2021-12-04 ENCOUNTER — Other Ambulatory Visit: Payer: Self-pay

## 2021-12-04 VITALS — BP 130/78 | HR 65 | Ht 71.0 in | Wt 221.0 lb

## 2021-12-04 DIAGNOSIS — Z6831 Body mass index (BMI) 31.0-31.9, adult: Secondary | ICD-10-CM | POA: Diagnosis not present

## 2021-12-04 DIAGNOSIS — E785 Hyperlipidemia, unspecified: Secondary | ICD-10-CM

## 2021-12-04 DIAGNOSIS — I700 Atherosclerosis of aorta: Secondary | ICD-10-CM | POA: Diagnosis not present

## 2021-12-04 DIAGNOSIS — I2510 Atherosclerotic heart disease of native coronary artery without angina pectoris: Secondary | ICD-10-CM

## 2021-12-04 NOTE — Patient Instructions (Addendum)
Medication Instructions:  ?Your physician recommends that you continue on your current medications as directed. Please refer to the Current Medication list given to you today. ? ?*If you need a refill on your cardiac medications before your next appointment, please call your pharmacy* ? ? ?Lab Work: ?Lipoprotein A today ? ?If you have labs (blood work) drawn today and your tests are completely normal, you will receive your results only by: ?MyChart Message (if you have MyChart) OR ?A paper copy in the mail ?If you have any lab test that is abnormal or we need to change your treatment, we will call you to review the results. ? ? ?Testing/Procedures: ?Your physician has requested that you have an echocardiogram. Echocardiography is a painless test that uses sound waves to create images of your heart. It provides your doctor with information about the size and shape of your heart and how well your heart?s chambers and valves are working. This procedure takes approximately one hour. There are no restrictions for this procedure. ? ?Your physician has requested that you have an exercise tolerance test. For further information please visit [email or web address]. Please also follow instruction sheet, as given. ? ? ?Follow-Up: ?At [facility], you and your health needs are our priority.  As part of our continuing mission to provide you with exceptional heart care, we have created designated Provider Care Teams.  These Care Teams include your primary Cardiologist (physician) and Advanced Practice Providers (APPs -  Physician Assistants and Nurse Practitioners) who all work together to provide you with the care you need, when you need it. ? ?We recommend signing up for the patient portal called "MyChart".  Sign up information is provided on this After Visit Summary.  MyChart is used to connect with patients for Virtual Visits (Telemedicine).  Patients are able to view lab/test results, encounter notes, upcoming appointments,  etc.  Non-urgent messages can be sent to your provider as well.   ?To learn more about what you can do with MyChart, go to [email or web address].   ? ?Your next appointment:   ?6 month(s) ? ?The format for your next appointment:   ?In Person ? ?Provider:   ?Lenna Sciara, MD  ? ? ?Other Instructions ?  ?

## 2021-12-05 ENCOUNTER — Telehealth: Payer: Self-pay | Admitting: *Deleted

## 2021-12-05 DIAGNOSIS — E785 Hyperlipidemia, unspecified: Secondary | ICD-10-CM

## 2021-12-05 LAB — LIPOPROTEIN A (LPA): Lipoprotein (a): 128.4 nmol/L — ABNORMAL HIGH (ref <75.0)

## 2021-12-05 NOTE — Telephone Encounter (Signed)
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  Let's check lipids and LFTs in 2 months. ?

## 2021-12-05 NOTE — Telephone Encounter (Signed)
Spoke with pt and reviewed results and recommendations.  Pt will come for labs on 01/29/22.  He is aware to be fasting for these.  Pt appreciative for call. ?

## 2021-12-26 ENCOUNTER — Ambulatory Visit (INDEPENDENT_AMBULATORY_CARE_PROVIDER_SITE_OTHER): Payer: BC Managed Care – PPO

## 2021-12-26 ENCOUNTER — Ambulatory Visit (HOSPITAL_COMMUNITY): Payer: BC Managed Care – PPO | Attending: Cardiology

## 2021-12-26 DIAGNOSIS — I700 Atherosclerosis of aorta: Secondary | ICD-10-CM | POA: Diagnosis not present

## 2021-12-26 DIAGNOSIS — E785 Hyperlipidemia, unspecified: Secondary | ICD-10-CM

## 2021-12-26 DIAGNOSIS — I2510 Atherosclerotic heart disease of native coronary artery without angina pectoris: Secondary | ICD-10-CM | POA: Diagnosis not present

## 2021-12-26 LAB — EXERCISE TOLERANCE TEST
Angina Index: 0
Estimated workload: 10.6
Exercise duration (min): 9 min
Exercise duration (sec): 21 s
MPHR: 159 {beats}/min
Peak HR: 151 {beats}/min
Percent HR: 94 %
RPE: 15
Rest HR: 65 {beats}/min

## 2021-12-26 LAB — ECHOCARDIOGRAM COMPLETE
Area-P 1/2: 3.42 cm2
S' Lateral: 3.1 cm

## 2022-01-29 ENCOUNTER — Other Ambulatory Visit: Payer: BC Managed Care – PPO | Admitting: *Deleted

## 2022-01-29 DIAGNOSIS — E785 Hyperlipidemia, unspecified: Secondary | ICD-10-CM

## 2022-01-30 ENCOUNTER — Telehealth: Payer: Self-pay

## 2022-01-30 DIAGNOSIS — Z79899 Other long term (current) drug therapy: Secondary | ICD-10-CM

## 2022-01-30 DIAGNOSIS — E785 Hyperlipidemia, unspecified: Secondary | ICD-10-CM

## 2022-01-30 LAB — HEPATIC FUNCTION PANEL
ALT: 33 IU/L (ref 0–44)
AST: 19 IU/L (ref 0–40)
Albumin: 4.7 g/dL (ref 3.8–4.8)
Alkaline Phosphatase: 79 IU/L (ref 44–121)
Bilirubin Total: 0.8 mg/dL (ref 0.0–1.2)
Bilirubin, Direct: 0.19 mg/dL (ref 0.00–0.40)
Total Protein: 6.9 g/dL (ref 6.0–8.5)

## 2022-01-30 LAB — LIPID PANEL
Chol/HDL Ratio: 4.3 ratio (ref 0.0–5.0)
Cholesterol, Total: 158 mg/dL (ref 100–199)
HDL: 37 mg/dL — ABNORMAL LOW (ref >39)
LDL Chol Calc (NIH): 102 mg/dL — ABNORMAL HIGH (ref 0–99)
Triglycerides: 100 mg/dL (ref 0–149)
VLDL Cholesterol Cal: 19 mg/dL (ref 5–40)

## 2022-01-30 MED ORDER — ROSUVASTATIN CALCIUM 40 MG PO TABS: 40.0000 mg | ORAL_TABLET | Freq: Every day | ORAL | 3 refills | Status: DC

## 2022-01-30 NOTE — Telephone Encounter (Signed)
-----   Message from Early Osmond, MD sent at 01/30/2022  8:30 AM EDT ----- LDL not at goal, please have him increase Crestor to '40mg'$  and FLP and LFTs in 2 months.

## 2022-01-30 NOTE — Telephone Encounter (Signed)
Called patient with results. Patient will come in on 04/16/22 for repeat labs, after increasing his Crestor to 40 mg.

## 2022-02-22 DIAGNOSIS — X32XXXA Exposure to sunlight, initial encounter: Secondary | ICD-10-CM | POA: Diagnosis not present

## 2022-02-22 DIAGNOSIS — L821 Other seborrheic keratosis: Secondary | ICD-10-CM | POA: Diagnosis not present

## 2022-02-22 DIAGNOSIS — C44629 Squamous cell carcinoma of skin of left upper limb, including shoulder: Secondary | ICD-10-CM | POA: Diagnosis not present

## 2022-02-22 DIAGNOSIS — D225 Melanocytic nevi of trunk: Secondary | ICD-10-CM | POA: Diagnosis not present

## 2022-02-22 DIAGNOSIS — L570 Actinic keratosis: Secondary | ICD-10-CM | POA: Diagnosis not present

## 2022-04-16 ENCOUNTER — Other Ambulatory Visit: Payer: BC Managed Care – PPO

## 2022-04-23 ENCOUNTER — Other Ambulatory Visit: Payer: BC Managed Care – PPO

## 2022-04-23 DIAGNOSIS — E785 Hyperlipidemia, unspecified: Secondary | ICD-10-CM | POA: Diagnosis not present

## 2022-04-23 DIAGNOSIS — I2510 Atherosclerotic heart disease of native coronary artery without angina pectoris: Secondary | ICD-10-CM | POA: Diagnosis not present

## 2022-04-23 DIAGNOSIS — Z125 Encounter for screening for malignant neoplasm of prostate: Secondary | ICD-10-CM | POA: Diagnosis not present

## 2022-04-23 DIAGNOSIS — Z79899 Other long term (current) drug therapy: Secondary | ICD-10-CM | POA: Diagnosis not present

## 2022-04-23 DIAGNOSIS — R7989 Other specified abnormal findings of blood chemistry: Secondary | ICD-10-CM | POA: Diagnosis not present

## 2022-04-24 LAB — HEPATIC FUNCTION PANEL
ALT: 29 IU/L (ref 0–44)
AST: 21 IU/L (ref 0–40)
Albumin: 4.7 g/dL (ref 3.9–4.9)
Alkaline Phosphatase: 79 IU/L (ref 44–121)
Bilirubin Total: 0.7 mg/dL (ref 0.0–1.2)
Bilirubin, Direct: 0.2 mg/dL (ref 0.00–0.40)
Total Protein: 6.5 g/dL (ref 6.0–8.5)

## 2022-04-24 LAB — LIPID PANEL
Chol/HDL Ratio: 4 ratio (ref 0.0–5.0)
Cholesterol, Total: 139 mg/dL (ref 100–199)
HDL: 35 mg/dL — ABNORMAL LOW (ref >39)
LDL Chol Calc (NIH): 65 mg/dL (ref 0–99)
Triglycerides: 243 mg/dL — ABNORMAL HIGH (ref 0–149)
VLDL Cholesterol Cal: 39 mg/dL (ref 5–40)

## 2022-04-30 ENCOUNTER — Other Ambulatory Visit: Payer: BC Managed Care – PPO

## 2022-04-30 DIAGNOSIS — Z1339 Encounter for screening examination for other mental health and behavioral disorders: Secondary | ICD-10-CM | POA: Diagnosis not present

## 2022-04-30 DIAGNOSIS — R7301 Impaired fasting glucose: Secondary | ICD-10-CM | POA: Diagnosis not present

## 2022-04-30 DIAGNOSIS — Z1331 Encounter for screening for depression: Secondary | ICD-10-CM | POA: Diagnosis not present

## 2022-04-30 DIAGNOSIS — Z0000 Encounter for general adult medical examination without abnormal findings: Secondary | ICD-10-CM | POA: Diagnosis not present

## 2022-06-05 NOTE — Progress Notes (Unsigned)
Cardiology Office Note:    Date:  06/06/2022   ID:  Jorge Gibson, DOB 1960-12-07, MRN [id number]  PCP:  Patient, No Pcp Per   [facility] [facility] Providers Cardiologist:  Lenna Sciara, MD Referring MD: No ref. provider found   Chief Complaint/Reason for Referral: Elevated coronary calcium score  ASSESSMENT:    1. Coronary artery calcification seen on CAT scan   2. Aortic atherosclerosis ([place])   3. Hyperlipidemia LDL goal <70   4. BMI 31.0-31.9,adult   5. LVH (left ventricular hypertrophy)   6. Elevated Lp(a)      PLAN:    In order of problems listed above: 1.  Coronary artery calcification: Reassuring treadmill stress test.  Continue aspirin and Crestor.   2.  Aortic atherosclerosis: Continue aspirin and Crestor with strict blood pressure control 3.  Hyperlipidemia: Recent LDL was 65.  Continue statin.   4.  Elevated BMI: Diet and exercise for now. 5.  Hypertension: Start losartan 25 mg daily given LVH; BMP next week. 6.  Elevated LP(a): We will refer to Dr. Debara Pickett for further recommendations.  Dispo:  Return in about 6 months (around 12/05/2022).      Medication Adjustments/Labs and Tests Ordered: Current medicines are reviewed at length with the patient today.  Concerns regarding medicines are outlined above.   Tests Ordered: Orders Placed This Encounter  Procedures   Basic Metabolic Panel (BMET)   AMB Referral to [facility]    Medication Changes: Meds ordered this encounter  Medications   losartan (COZAAR) 25 MG tablet    Sig: Take 1 tablet (25 mg total) by mouth daily.    Dispense:  90 tablet    Refill:  3    History of Present Illness:    FOCUSED PROBLEM LIST:   1.  Elevated calcium score and atherosclerotic aorta on CT 2023 2.  BMI of 31 3.  COVID infection 2020 4.  Hyperlipidemia with elevated LP(a)  March 2023 consultation: The patient is a 61 y.o. male with the indicated medical history here for recommendations regarding  elevated calcium score.  The patient was referred for calcium score for screening purposes.  He is here to see cardiology for further recommendations.  He was started appropriately on aspirin and rosuvastatin by his PCP.  He gets chest pain when he lays down to sleep.  It is associated with occasional very rare palpitations.  He does not really get exertional chest pain.  He may get that maybe once every month.  He denies any significant shortness of breath.  He has had orthostatic symptoms since contracting COVID.  He denies any orthopnea, peripheral edema, signs or symptoms stroke, or severe bleeding.  He he does not smoke and he works in [employer], technical sales.  He has lost about 20 pounds through diet and exercise.  Plan: Obtain echocardiogram and exercise stress test.  Today: In the interim the patient had an ETT which was nonsignificant for ischemia.  Echocardiogram was performed which demonstrated preserved ejection fraction with mild left ventricular hypertrophy.  He had his Crestor increased to 40 mg.  The patient continues to do well.  He denies any signs or symptoms of cardiovascular disease such as angina or dyspnea.  He denies any presyncope or syncope.  He is tolerating his medications well.  He has chronic arthralgias which are not worse after starting Crestor.  He has required no emergency room visits or hospitalizations.     Current Medications: Current Meds  Medication Sig  aspirin EC 81 MG tablet 1 tablet Orally Once a day   Chlorpheniramine Maleate (ALLERGY RELIEF PO) Take 10 mg by mouth daily.   losartan (COZAAR) 25 MG tablet Take 1 tablet (25 mg total) by mouth daily.   rosuvastatin (CRESTOR) 40 MG tablet Take 1 tablet (40 mg total) by mouth daily.     Allergies:    Patient has no known allergies.   Social History:   Social History   Tobacco Use   Smoking status: Never   Smokeless tobacco: Never  Substance Use Topics   Alcohol use: Yes    Comment: social     Family Hx: Family  History  Problem Relation Age of Onset   Cancer Mother    Cancer Father    Healthy Sister    Healthy Brother    Cancer Paternal Grandfather      Review of Systems:   Please see the history of present illness.    All other systems reviewed and are negative.     EKGs/Labs/Other Test Reviewed:    EKG: 2020 sinus rhythm; EKG today demonstrates sinus rhythm with possible anterior septal MI pattern.  Prior CV studies:  ETT 2023:   Good exercise capacity, achieved 10.6 METS   Hypertensive response to exercise, peak BP 224/87   Peak HR 151 bpm (94% max age predicted HR)   Up sloping ST depression was noted.  No stress EKG evidence of ischemia  TTE 2023:  1. Left ventricular ejection fraction, by estimation, is 60 to 65%. The  left ventricle has normal function. The left ventricle has no regional  wall motion abnormalities. There is mild left ventricular hypertrophy.  Left ventricular diastolic parameters  were normal.   2. Right ventricular systolic function is normal. The right ventricular  size is normal.   3. The mitral valve is normal in structure. Trivial mitral valve  regurgitation. No evidence of mitral stenosis.   4. The aortic valve is tricuspid. Aortic valve regurgitation is not  visualized. Aortic valve sclerosis is present, with no evidence of aortic  valve stenosis.   5. The inferior vena cava is normal in size with greater than 50%  respiratory variability, suggesting right atrial pressure of 3 mmHg.   CT 2023 Left Main: 98.2 LAD: 390 LCx: 51 RCA: 1166 Total Agatston Score: 1706  1. Calculated coronary calcium score is 1706 and this is at percentile 98 for patients of the same age, gender and ethnicity. Please note that the calcium score in the right coronary artery is likely artificially elevated due to over sampling from the motion artifact. 2.  Aortic Atherosclerosis (ICD10-I70.0).  Imaging studies that I have independently reviewed today:  CT  Recent Labs: 04/23/2022: ALT 29   Recent Lipid Panel Lab Results  Component Value Date/Time   CHOL 139 04/23/2022 01:02 PM   TRIG 243 (H) 04/23/2022 01:02 PM   HDL 35 (L) 04/23/2022 01:02 PM   LDLCALC 65 04/23/2022 01:02 PM    Risk Assessment/Calculations:          Physical Exam:    VS:  BP (!) 150/80   Pulse 72   Ht '5\' 11"'$  (1.803 m)   Wt 233 lb 12.8 oz (106.1 kg)   SpO2 96%   BMI 32.61 kg/m    Wt Readings from Last 3 Encounters:  06/06/22 233 lb 12.8 oz (106.1 kg)  12/04/21 221 lb (100.2 kg)  08/14/19 200 lb (90.7 kg)    HYPERTENSION CONTROL Vitals:   06/06/22 9562  06/06/22 0940  BP: (!) 156/76 (!) 150/80    The patient's blood pressure is elevated above target today.  In order to address the patient's elevated BP: A new medication was prescribed today.       GENERAL:  No apparent distress, AOx3 HEENT:  No carotid bruits, +2 carotid impulses, no scleral icterus CAR: RRR no murmurs, gallops, rubs, or thrills RES:  Clear to auscultation bilaterally ABD:  Soft, nontender, nondistended, positive bowel sounds x 4 VASC:  +2 radial pulses, +2 carotid pulses, palpable pedal pulses NEURO:  CN 2-12 grossly intact; motor and sensory grossly intact PSYCH:  No active depression or anxiety EXT:  No edema, ecchymosis, or cyanosis  Signed, Early Osmond, MD  06/06/2022 9:46 AM    [address], [place], [place]  [address] Phone: [phone]; Fax: [phone]   Note:  This document was prepared using Dragon voice recognition software and may include unintentional dictation errors.

## 2022-06-06 ENCOUNTER — Encounter: Payer: Self-pay | Admitting: Internal Medicine

## 2022-06-06 ENCOUNTER — Ambulatory Visit: Payer: BC Managed Care – PPO | Attending: Internal Medicine | Admitting: Internal Medicine

## 2022-06-06 VITALS — BP 150/80 | HR 72 | Ht 71.0 in | Wt 233.8 lb

## 2022-06-06 DIAGNOSIS — E785 Hyperlipidemia, unspecified: Secondary | ICD-10-CM | POA: Diagnosis not present

## 2022-06-06 DIAGNOSIS — Z6831 Body mass index (BMI) 31.0-31.9, adult: Secondary | ICD-10-CM | POA: Diagnosis not present

## 2022-06-06 DIAGNOSIS — I700 Atherosclerosis of aorta: Secondary | ICD-10-CM | POA: Diagnosis not present

## 2022-06-06 DIAGNOSIS — I517 Cardiomegaly: Secondary | ICD-10-CM

## 2022-06-06 DIAGNOSIS — E7841 Elevated Lipoprotein(a): Secondary | ICD-10-CM

## 2022-06-06 DIAGNOSIS — I2510 Atherosclerotic heart disease of native coronary artery without angina pectoris: Secondary | ICD-10-CM

## 2022-06-06 MED ORDER — LOSARTAN POTASSIUM 25 MG PO TABS: 25.0000 mg | ORAL_TABLET | Freq: Every day | ORAL | 3 refills | Status: DC

## 2022-06-06 NOTE — Patient Instructions (Signed)
Medication Instructions:  Your physician has recommended you make the following change in your medication:  1.) start losartan 25 mg - take one tablet daily  *If you need a refill on your cardiac medications before your next appointment, please call your pharmacy*   Lab Work: Please return in about 1 week for blood work Artist)  If you have labs (blood work) drawn today and your tests are completely normal, you will receive your results only by: [employer] (if you have MyChart) OR A paper copy in the mail If you have any lab test that is abnormal or we need to change your treatment, we will call you to review the results.   Testing/Procedures: none   Follow-Up:  You have been referred to Advanced Lipid Clinic Physician - Dr. Debara Pickett.  At [facility], you and your health needs are our priority.  As part of our continuing mission to provide you with exceptional heart care, we have created designated Provider Care Teams.  These Care Teams include your primary Cardiologist (physician) and Advanced Practice Providers (APPs -  Physician Assistants and Nurse Practitioners) who all work together to provide you with the care you need, when you need it.   Your next appointment:   6 month(s)  The format for your next appointment:   In Person  Provider:   Lenna Sciara, MD   Important Information About Sugar

## 2022-06-13 ENCOUNTER — Ambulatory Visit: Payer: BC Managed Care – PPO | Attending: Internal Medicine

## 2022-06-13 DIAGNOSIS — I517 Cardiomegaly: Secondary | ICD-10-CM

## 2022-06-13 DIAGNOSIS — I2510 Atherosclerotic heart disease of native coronary artery without angina pectoris: Secondary | ICD-10-CM

## 2022-06-13 DIAGNOSIS — I700 Atherosclerosis of aorta: Secondary | ICD-10-CM | POA: Diagnosis not present

## 2022-06-14 LAB — BASIC METABOLIC PANEL
BUN/Creatinine Ratio: 20 (ref 10–24)
BUN: 17 mg/dL (ref 8–27)
CO2: 22 mmol/L (ref 20–29)
Calcium: 9.6 mg/dL (ref 8.6–10.2)
Chloride: 103 mmol/L (ref 96–106)
Creatinine, Ser: 0.84 mg/dL (ref 0.76–1.27)
Glucose: 97 mg/dL (ref 70–99)
Potassium: 4.5 mmol/L (ref 3.5–5.2)
Sodium: 141 mmol/L (ref 134–144)
eGFR: 99 mL/min/{1.73_m2} (ref >59)

## 2022-06-25 ENCOUNTER — Ambulatory Visit (AMBULATORY_SURGERY_CENTER): Payer: Self-pay | Admitting: *Deleted

## 2022-06-25 ENCOUNTER — Encounter: Payer: Self-pay | Admitting: Internal Medicine

## 2022-06-25 VITALS — Ht 71.0 in | Wt 225.0 lb

## 2022-06-25 DIAGNOSIS — Z1211 Encounter for screening for malignant neoplasm of colon: Secondary | ICD-10-CM

## 2022-06-25 MED ORDER — NA SULFATE-K SULFATE-MG SULF 17.5-3.13-1.6 GM/177ML PO SOLN: 1.0000 | Freq: Once | ORAL | 0 refills | Status: AC

## 2022-06-25 NOTE — Progress Notes (Signed)
No egg or soy allergy known to patient  No issues known to pt with past sedation with any surgeries or procedures Patient denies ever being told they had issues or difficulty with intubation  No FH of Malignant Hyperthermia Pt is not on diet pills Pt is not on  home 02  Pt is not on blood thinners  Pt denies issues with constipation  No A fib or A flutter Have any cardiac testing pending--no Pt instructed to use Singlecare.com or GoodRx for a price reduction on prep    Prep instructions sent via my chart remained on phone with pt. To assure that he was able to obtain prep instructions.

## 2022-07-09 ENCOUNTER — Ambulatory Visit (AMBULATORY_SURGERY_CENTER): Payer: BC Managed Care – PPO | Admitting: Internal Medicine

## 2022-07-09 ENCOUNTER — Encounter: Payer: Self-pay | Admitting: Internal Medicine

## 2022-07-09 VITALS — BP 132/67 | HR 64 | Temp 99.1°F | Resp 18 | Ht 71.0 in | Wt 225.0 lb

## 2022-07-09 DIAGNOSIS — Z1211 Encounter for screening for malignant neoplasm of colon: Secondary | ICD-10-CM

## 2022-07-09 DIAGNOSIS — D125 Benign neoplasm of sigmoid colon: Secondary | ICD-10-CM | POA: Diagnosis not present

## 2022-07-09 DIAGNOSIS — D123 Benign neoplasm of transverse colon: Secondary | ICD-10-CM | POA: Diagnosis not present

## 2022-07-09 DIAGNOSIS — D122 Benign neoplasm of ascending colon: Secondary | ICD-10-CM

## 2022-07-09 DIAGNOSIS — D120 Benign neoplasm of cecum: Secondary | ICD-10-CM

## 2022-07-09 HISTORY — PX: COLONOSCOPY: SHX174

## 2022-07-09 NOTE — Patient Instructions (Signed)
YOU HAD AN ENDOSCOPIC PROCEDURE TODAY AT [facility]:   Refer to the procedure report that was given to you for any specific questions about what was found during the examination.  If the procedure report does not answer your questions, please call your gastroenterologist to clarify.  If you requested that your care partner not be given the details of your procedure findings, then the procedure report has been included in a sealed envelope for you to review at your convenience later.  YOU SHOULD EXPECT: Some feelings of bloating in the abdomen. Passage of more gas than usual.  Walking can help get rid of the air that was put into your GI tract during the procedure and reduce the bloating. If you had a lower endoscopy (such as a colonoscopy or flexible sigmoidoscopy) you may notice spotting of blood in your stool or on the toilet paper. If you underwent a bowel prep for your procedure, you may not have a normal bowel movement for a few days.  Please Note:  You might notice some irritation and congestion in your nose or some drainage.  This is from the oxygen used during your procedure.  There is no need for concern and it should clear up in a day or so.  SYMPTOMS TO REPORT IMMEDIATELY:  Following lower endoscopy (colonoscopy or flexible sigmoidoscopy):  Excessive amounts of blood in the stool  Significant tenderness or worsening of abdominal pains  Swelling of the abdomen that is new, acute  Fever of 100F or higher   For urgent or emergent issues, a gastroenterologist can be reached at any hour by calling [phone]. Do not use MyChart messaging for urgent concerns.    DIET:  We do recommend a small meal at first, but then you may proceed to your regular diet.  Drink plenty of fluids but you should avoid alcoholic beverages for 24 hours.  MEDICATIONS: Continue present medications.  Please see handouts given to you by your recovery nurse: Polyps, hemorroids.  Thank you  for allowing [place] to provide for your healthcare needs today.  ACTIVITY:  You should plan to take it easy for the rest of today and you should NOT DRIVE or use heavy machinery until tomorrow (because of the sedation medicines used during the test).    FOLLOW UP: Our staff will call the number listed on your records the next business day following your procedure.  We will call around 7:15- 8:00 am to check on you and address any questions or concerns that you may have regarding the information given to you following your procedure. If we do not reach you, we will leave a message.     If any biopsies were taken you will be contacted by phone or by letter within the next 1-3 weeks.  Please call us at [phone] if you have not heard about the biopsies in 3 weeks.    SIGNATURES/CONFIDENTIALITY: You and/or your care partner have signed paperwork which will be entered into your electronic medical record.  These signatures attest to the fact that that the information above on your After Visit Summary has been reviewed and is understood.  Full responsibility of the confidentiality of this discharge information lies with you and/or your care-partner.

## 2022-07-09 NOTE — Op Note (Signed)
[facility] Patient Name: Jorge Gibson Procedure Date: 07/09/2022 9:14 AM MRN: [id number] Endoscopist: Adline Mango [place] , , [id number] Age: 61 Referring MD:  Date of Birth: 1961-07-11 Gender: Male Account #: [id number] Procedure:                Colonoscopy Indications:              Screening for colorectal malignant neoplasm, This                            is the patient's first colonoscopy Medicines:                Monitored Anesthesia Care Procedure:                Pre-Anesthesia Assessment:                           - Prior to the procedure, a History and Physical                            was performed, and patient medications and                            allergies were reviewed. The patient's tolerance of                            previous anesthesia was also reviewed. The risks                            and benefits of the procedure and the sedation                            options and risks were discussed with the patient.                            All questions were answered, and informed consent                            was obtained. Prior Anticoagulants: The patient has                            taken no anticoagulant or antiplatelet agents. ASA                            Grade Assessment: II - A patient with mild systemic                            disease. After reviewing the risks and benefits,                            the patient was deemed in satisfactory condition to                            undergo the procedure.  After obtaining informed consent, the colonoscope                            was passed under direct vision. Throughout the                            procedure, the patient's blood pressure, pulse, and                            oxygen saturations were monitored continuously. The                            PCF-HQ190L Colonoscope was introduced through the                            anus and advanced  to the the terminal ileum. The                            colonoscopy was performed without difficulty. The                            patient tolerated the procedure well. The quality                            of the bowel preparation was good. The terminal                            ileum, ileocecal valve, appendiceal orifice, and                            rectum were photographed. Scope In: 9:14:05 AM Scope Out: 9:37:24 AM Scope Withdrawal Time: 0 hours 18 minutes 13 seconds  Total Procedure Duration: 0 hours 23 minutes 19 seconds  Findings:                 The terminal ileum appeared normal.                           Four sessile polyps were found in the transverse                            colon, ascending colon and cecum. The polyps were 3                            to 5 mm in size. These polyps were removed with a                            cold snare. Resection and retrieval were complete.                           A 3 mm polyp was found in the sigmoid colon. The                            polyp was  sessile. The polyp was removed with a                            cold snare. Resection and retrieval were complete.                           Non-bleeding internal hemorrhoids were found during                            retroflexion. Complications:            No immediate complications. Estimated Blood Loss:     Estimated blood loss was minimal. Impression:               - The examined portion of the ileum was normal.                           - Four 3 to 5 mm polyps in the transverse colon, in                            the ascending colon and in the cecum, removed with                            a cold snare. Resected and retrieved.                           - One 3 mm polyp in the sigmoid colon, removed with                            a cold snare. Resected and retrieved.                           - Non-bleeding internal hemorrhoids. Recommendation:           - Discharge  patient to home (with escort).                           - Await pathology results.                           - The findings and recommendations were discussed                            with the patient. Dr Georgian Co "[place]" Lorenso Courier,  07/09/2022 9:41:02 AM

## 2022-07-09 NOTE — Progress Notes (Signed)
Pt's states no medical or surgical changes since previsit or office visit. 

## 2022-07-09 NOTE — Progress Notes (Signed)
Called to room to assist during endoscopic procedure.  Patient ID and intended procedure confirmed with present staff. Received instructions for my participation in the procedure from the performing physician.  

## 2022-07-09 NOTE — Progress Notes (Signed)
Report to PACU, RN, vss, BBS= Clear.  

## 2022-07-09 NOTE — Progress Notes (Signed)
GASTROENTEROLOGY PROCEDURE H&P NOTE   Primary Care Physician: Patient, No Pcp Per    Reason for Procedure:   Colon cancer screening  Plan:    Colonoscopy  Patient is appropriate for endoscopic procedure(s) in the ambulatory ([facility]) setting.  The nature of the procedure, as well as the risks, benefits, and alternatives were carefully and thoroughly reviewed with the patient. Ample time for discussion and questions allowed. The patient understood, was satisfied, and agreed to proceed.     HPI: Jorge Gibson is a 61 y.o. male who presents for colonoscopy for colon cancer screening. Denies blood in stools, changes in bowel habits, or unintentional weight loss. Denies family history of colon cancer. This is his first colonoscopy.  Past Medical History:  Diagnosis Date   Allergy    tree pollen   Atherosclerosis of aorta (HCC)    CAD (coronary artery disease)    COVID    Elevated coronary artery calcium score    Epicondylitis elbow, medial    Family history of early CAD    History of smoking    Hyperlipidemia    Hypertension    Pulmonary nodule     Past Surgical History:  Procedure Laterality Date   APPENDECTOMY     COLONOSCOPY  07/09/2022   CORNEAL TRANSPLANT     FINGER FRACTURE SURGERY      Prior to Admission medications   Medication Sig Start Date End Date Taking? Authorizing Provider  aspirin EC 81 MG tablet 1 tablet Orally Once a day 11/09/21  Yes [provider]  Chlorpheniramine Maleate (ALLERGY RELIEF PO) Take 10 mg by mouth as needed.   Yes [provider]  losartan (COZAAR) 25 MG tablet Take 1 tablet (25 mg total) by mouth daily. 06/06/22  Yes Early Osmond, MD  rosuvastatin (CRESTOR) 40 MG tablet Take 1 tablet (40 mg total) by mouth daily. 01/30/22  Yes Early Osmond, MD    Current Outpatient Medications  Medication Sig Dispense Refill   aspirin EC 81 MG tablet 1 tablet Orally Once a day     Chlorpheniramine Maleate (ALLERGY RELIEF  PO) Take 10 mg by mouth as needed.     losartan (COZAAR) 25 MG tablet Take 1 tablet (25 mg total) by mouth daily. 90 tablet 3   rosuvastatin (CRESTOR) 40 MG tablet Take 1 tablet (40 mg total) by mouth daily. 90 tablet 3   No current facility-administered medications for this visit.    Allergies as of 07/09/2022   (No Known Allergies)    Family History  Problem Relation Age of Onset   Cancer Mother    Cancer Father    Healthy Sister    Healthy Brother    Cancer Paternal Grandfather    Colon cancer Neg Hx    Colon polyps Neg Hx    Crohn's disease Neg Hx    Esophageal cancer Neg Hx    Rectal cancer Neg Hx    Stomach cancer Neg Hx    Ulcerative colitis Neg Hx     Social History   Socioeconomic History   Marital status: Divorced    Spouse name: Not on file   Number of children: Not on file   Years of education: Not on file   Highest education level: Not on file  Occupational History   Not on file  Tobacco Use   Smoking status: Never    Passive exposure: Never   Smokeless tobacco: Never  Vaping Use   Vaping Use: Never  used  Substance and Sexual Activity   Alcohol use: Yes    Comment: rarely   Drug use: Never   Sexual activity: Yes  Other Topics Concern   Not on file  Social History Narrative   Not on file   Social Determinants of Health   Financial Resource Strain: Not on file  Food Insecurity: Not on file  Transportation Needs: Not on file  Physical Activity: Not on file  Stress: Not on file  Social Connections: Not on file  Intimate Partner Violence: Not on file    Physical Exam: Vital signs in last 24 hours: BP (!) 142/62   Pulse 66   Temp 99.1 F (37.3 C) (Temporal)   Ht '5\' 11"'$  (1.803 m)   Wt 225 lb (102.1 kg)   SpO2 96%   BMI 31.38 kg/m  GEN: NAD EYE: Sclerae anicteric ENT: MMM CV: Non-tachycardic Pulm: No increased work of breathing GI: Soft, NT/ND NEURO:  Alert & Oriented   Christia Reading, MD [facility]  07/09/2022  8:39 AM

## 2022-07-10 ENCOUNTER — Telehealth: Payer: Self-pay

## 2022-07-10 NOTE — Telephone Encounter (Signed)
  Follow up Call-     07/09/2022    8:26 AM  Call back number  Post procedure Call Back phone  # [phone]  Permission to leave phone message Yes     Patient questions:  Do you have a fever, pain , or abdominal swelling? No. Pain Score  0 *  Have you tolerated food without any problems? Yes.    Have you been able to return to your normal activities? Yes.    Do you have any questions about your discharge instructions: Diet   No. Medications  No. Follow up visit  No.  Do you have questions or concerns about your Care? No.  Actions: * If pain score is 4 or above: No action needed, pain <4.

## 2022-07-11 ENCOUNTER — Encounter: Payer: Self-pay | Admitting: Internal Medicine

## 2022-11-06 NOTE — Progress Notes (Unsigned)
Cardiology Office Note:    Date:  11/06/2022   ID:  DONTARIUS DEVALL, DOB Aug 31, 1961, MRN [id number]  PCP:  Patient, No Pcp Per   [facility] Providers Cardiologist:  Lenna Sciara, MD Referring MD: No ref. provider found   Chief Complaint/Reason for Referral: Elevated coronary calcium score  ASSESSMENT:    1. Coronary artery calcification seen on CAT scan   2. Aortic atherosclerosis ([place])   3. Hyperlipidemia LDL goal <70   4. BMI 31.0-31.9,adult   5. Primary hypertension       PLAN:    In order of problems listed above: 1.  Coronary artery calcification:  Continue aspirin and Crestor.   2.  Aortic atherosclerosis: Continue aspirin and Crestor with strict blood pressure control 3.  Hyperlipidemia: Recent LDL was 65.  Given elevated LP(a), goal LDL is less than 55.  Check lipid panel and LFTs today. 4.  Elevated BMI: We will consider pharmacy referral.. 5.  Hypertension:    Dispo:  No follow-ups on file.      Medication Adjustments/Labs and Tests Ordered: Current medicines are reviewed at length with the patient today.  Concerns regarding medicines are outlined above.   Tests Ordered: No orders of the defined types were placed in this encounter.   Medication Changes: No orders of the defined types were placed in this encounter.   History of Present Illness:    FOCUSED PROBLEM LIST:   1.  Elevated calcium score and atherosclerotic aorta on CT 2023 2.  BMI of 31 3.  COVID infection 2020 4.  Hyperlipidemia with elevated LP(a)  March 2023 consultation: The patient is a 62 y.o. male with the indicated medical history here for recommendations regarding elevated calcium score.  The patient was referred for calcium score for screening purposes.  He is here to see cardiology for further recommendations.  He was started appropriately on aspirin and rosuvastatin by his PCP.  He gets chest pain when he lays down to sleep.  It is associated with occasional very rare  palpitations.  He does not really get exertional chest pain.  He may get that maybe once every month.  He denies any significant shortness of breath.  He has had orthostatic symptoms since contracting COVID.  He denies any orthopnea, peripheral edema, signs or symptoms stroke, or severe bleeding.  He he does not smoke and he works in [employer], technical sales.  He has lost about 20 pounds through diet and exercise.  Plan: Obtain echocardiogram and exercise stress test.  September 2023: In the interim the patient had an ETT which was nonsignificant for ischemia.  Echocardiogram was performed which demonstrated preserved ejection fraction with mild left ventricular hypertrophy.  He had his Crestor increased to 40 mg.  The patient continues to do well.  He denies any signs or symptoms of cardiovascular disease such as angina or dyspnea.  He denies any presyncope or syncope.  He is tolerating his medications well.  He has chronic arthralgias which are not worse after starting Crestor.  He has required no emergency room visits or hospitalizations.  Plan: Start losartan 25 mg due to LVH.   Today:       Current Medications: No outpatient medications have been marked as taking for the 11/12/22 encounter (Appointment) with Early Osmond, MD.     Allergies:    Patient has no known allergies.   Social History:   Social History   Tobacco Use   Smoking status: Never    Passive exposure: Never  Smokeless tobacco: Never  Vaping Use   Vaping Use: Never used  Substance Use Topics   Alcohol use: Yes    Comment: rarely   Drug use: Never     Family Hx: Family History  Problem Relation Age of Onset   Cancer Mother    Cancer Father    Healthy Sister    Healthy Brother    Cancer Paternal Grandfather    Colon cancer Neg Hx    Colon polyps Neg Hx    Crohn's disease Neg Hx    Esophageal cancer Neg Hx    Rectal cancer Neg Hx    Stomach cancer Neg Hx    Ulcerative colitis Neg Hx      Review of Systems:   Please see  the history of present illness.    All other systems reviewed and are negative.     EKGs/Labs/Other Test Reviewed:    EKG: 2020 sinus rhythm; EKG today demonstrates sinus rhythm with possible anterior septal MI pattern.  Prior CV studies:  ETT 2023:   Good exercise capacity, achieved 10.6 METS   Hypertensive response to exercise, peak BP 224/87   Peak HR 151 bpm (94% max age predicted HR)   Up sloping ST depression was noted.  No stress EKG evidence of ischemia  TTE 2023:  1. Left ventricular ejection fraction, by estimation, is 60 to 65%. The  left ventricle has normal function. The left ventricle has no regional  wall motion abnormalities. There is mild left ventricular hypertrophy.  Left ventricular diastolic parameters  were normal.   2. Right ventricular systolic function is normal. The right ventricular  size is normal.   3. The mitral valve is normal in structure. Trivial mitral valve  regurgitation. No evidence of mitral stenosis.   4. The aortic valve is tricuspid. Aortic valve regurgitation is not  visualized. Aortic valve sclerosis is present, with no evidence of aortic  valve stenosis.   5. The inferior vena cava is normal in size with greater than 50%  respiratory variability, suggesting right atrial pressure of 3 mmHg.   CT 2023 Left Main: 98.2 LAD: 390 LCx: 51 RCA: 1166 Total Agatston Score: 1706 -1. Calculated coronary calcium score is 1706 and this is at percentile 98 for patients of the same age, gender and ethnicity. Please note that the calcium score in the right coronary artery is likely artificially elevated due to over sampling from the motion artifact. -2.  Aortic Atherosclerosis (ICD10-I70.0).  Imaging studies that I have independently reviewed today: CT  Recent Labs: 04/23/2022: ALT 29 06/13/2022: BUN 17; Creatinine, Ser 0.84; Potassium 4.5; Sodium 141   Recent Lipid Panel Lab Results  Component Value Date/Time   CHOL 139 04/23/2022 01:02  PM   TRIG 243 (H) 04/23/2022 01:02 PM   HDL 35 (L) 04/23/2022 01:02 PM   LDLCALC 65 04/23/2022 01:02 PM    Risk Assessment/Calculations:          Physical Exam:    VS:  There were no vitals taken for this visit.   Wt Readings from Last 3 Encounters:  07/09/22 225 lb (102.1 kg)  06/25/22 225 lb (102.1 kg)  06/06/22 233 lb 12.8 oz (106.1 kg)    No BP recorded.  {Refresh Note OR Click here to enter BP  :1}***    GENERAL:  No apparent distress, AOx3 HEENT:  No carotid bruits, +2 carotid impulses, no scleral icterus CAR: RRR no murmurs, gallops, rubs, or thrills RES:  Clear to auscultation bilaterally  ABD:  Soft, nontender, nondistended, positive bowel sounds x 4 VASC:  +2 radial pulses, +2 carotid pulses, palpable pedal pulses NEURO:  CN 2-12 grossly intact; motor and sensory grossly intact PSYCH:  No active depression or anxiety EXT:  No edema, ecchymosis, or cyanosis  Signed, Early Osmond, MD  11/06/2022 1:42 PM    [facility] [address], [place], [place]  [address] Phone: [phone]; Fax: [phone]   Note:  This document was prepared using Dragon voice recognition software and may include unintentional dictation errors.

## 2022-11-12 ENCOUNTER — Encounter: Payer: Self-pay | Admitting: Internal Medicine

## 2022-11-12 ENCOUNTER — Ambulatory Visit: Payer: No Typology Code available for payment source | Attending: Internal Medicine | Admitting: Internal Medicine

## 2022-11-12 ENCOUNTER — Other Ambulatory Visit: Payer: Self-pay | Admitting: Internal Medicine

## 2022-11-12 VITALS — BP 150/70 | HR 65 | Ht 71.0 in | Wt 224.8 lb

## 2022-11-12 DIAGNOSIS — Z6831 Body mass index (BMI) 31.0-31.9, adult: Secondary | ICD-10-CM

## 2022-11-12 DIAGNOSIS — I2510 Atherosclerotic heart disease of native coronary artery without angina pectoris: Secondary | ICD-10-CM

## 2022-11-12 DIAGNOSIS — E785 Hyperlipidemia, unspecified: Secondary | ICD-10-CM

## 2022-11-12 DIAGNOSIS — I700 Atherosclerosis of aorta: Secondary | ICD-10-CM

## 2022-11-12 DIAGNOSIS — I10 Essential (primary) hypertension: Secondary | ICD-10-CM

## 2022-11-12 DIAGNOSIS — R911 Solitary pulmonary nodule: Secondary | ICD-10-CM

## 2022-11-12 MED ORDER — LOSARTAN POTASSIUM 50 MG PO TABS: 50.0000 mg | ORAL_TABLET | Freq: Every day | ORAL | 3 refills | Status: DC

## 2022-11-12 NOTE — Patient Instructions (Signed)
Medication Instructions:  Your physician has recommended you make the following change in your medication:  1.) increase losartan to 50 mg - take one tablet daily  *If you need a refill on your cardiac medications before your next appointment, please call your pharmacy*   Lab Work: Please return in 1 week for blood work (cmet, lipids) If you have labs (blood work) drawn today and your tests are completely normal, you will receive your results only by: [place] (if you have MyChart) OR A paper copy in the mail If you have any lab test that is abnormal or we need to change your treatment, we will call you to review the results.   Testing/Procedures: none   Follow-Up: At [facility], you and your health needs are our priority.  As part of our continuing mission to provide you with exceptional heart care, we have created designated Provider Care Teams.  These Care Teams include your primary Cardiologist (physician) and Advanced Practice Providers (APPs -  Physician Assistants and Nurse Practitioners) who all work together to provide you with the care you need, when you need it.   Your next appointment:   12 month(s)  Provider:   Advanced Practice Provider (NP or PA-C)  Other Instructions Nurse visit in about one month for blood pressure check.

## 2022-11-19 ENCOUNTER — Ambulatory Visit: Payer: No Typology Code available for payment source | Attending: Internal Medicine

## 2022-11-19 DIAGNOSIS — I10 Essential (primary) hypertension: Secondary | ICD-10-CM

## 2022-11-19 DIAGNOSIS — E785 Hyperlipidemia, unspecified: Secondary | ICD-10-CM

## 2022-11-19 DIAGNOSIS — I2510 Atherosclerotic heart disease of native coronary artery without angina pectoris: Secondary | ICD-10-CM

## 2022-11-19 DIAGNOSIS — Z6831 Body mass index (BMI) 31.0-31.9, adult: Secondary | ICD-10-CM

## 2022-11-19 DIAGNOSIS — I700 Atherosclerosis of aorta: Secondary | ICD-10-CM

## 2022-11-19 LAB — COMPREHENSIVE METABOLIC PANEL
ALT: 27 IU/L (ref 0–44)
AST: 18 IU/L (ref 0–40)
Albumin/Globulin Ratio: 2.5 — ABNORMAL HIGH (ref 1.2–2.2)
Albumin: 4.9 g/dL (ref 3.9–4.9)
Alkaline Phosphatase: 77 IU/L (ref 44–121)
BUN/Creatinine Ratio: 23 (ref 10–24)
BUN: 18 mg/dL (ref 8–27)
Bilirubin Total: 0.8 mg/dL (ref 0.0–1.2)
CO2: 24 mmol/L (ref 20–29)
Calcium: 9.7 mg/dL (ref 8.6–10.2)
Chloride: 101 mmol/L (ref 96–106)
Creatinine, Ser: 0.78 mg/dL (ref 0.76–1.27)
Globulin, Total: 2 g/dL (ref 1.5–4.5)
Glucose: 101 mg/dL — ABNORMAL HIGH (ref 70–99)
Potassium: 4.4 mmol/L (ref 3.5–5.2)
Sodium: 138 mmol/L (ref 134–144)
Total Protein: 6.9 g/dL (ref 6.0–8.5)
eGFR: 101 mL/min/{1.73_m2} (ref >59)

## 2022-11-19 LAB — LIPID PANEL
Chol/HDL Ratio: 4.5 ratio (ref 0.0–5.0)
Cholesterol, Total: 148 mg/dL (ref 100–199)
HDL: 33 mg/dL — ABNORMAL LOW (ref >39)
LDL Chol Calc (NIH): 90 mg/dL (ref 0–99)
Triglycerides: 140 mg/dL (ref 0–149)
VLDL Cholesterol Cal: 25 mg/dL (ref 5–40)

## 2022-11-29 ENCOUNTER — Telehealth: Payer: Self-pay | Admitting: Internal Medicine

## 2022-11-29 ENCOUNTER — Ambulatory Visit (HOSPITAL_BASED_OUTPATIENT_CLINIC_OR_DEPARTMENT_OTHER): Payer: No Typology Code available for payment source | Admitting: Internal Medicine

## 2022-11-29 ENCOUNTER — Encounter (HOSPITAL_BASED_OUTPATIENT_CLINIC_OR_DEPARTMENT_OTHER): Payer: Self-pay | Admitting: Internal Medicine

## 2022-11-29 VITALS — BP 130/75 | HR 58 | Ht 71.0 in | Wt 218.0 lb

## 2022-11-29 DIAGNOSIS — I700 Atherosclerosis of aorta: Secondary | ICD-10-CM

## 2022-11-29 DIAGNOSIS — E785 Hyperlipidemia, unspecified: Secondary | ICD-10-CM | POA: Diagnosis not present

## 2022-11-29 DIAGNOSIS — E7841 Elevated Lipoprotein(a): Secondary | ICD-10-CM | POA: Diagnosis not present

## 2022-11-29 DIAGNOSIS — I2510 Atherosclerotic heart disease of native coronary artery without angina pectoris: Secondary | ICD-10-CM | POA: Diagnosis not present

## 2022-11-29 NOTE — Patient Instructions (Signed)
Medication Instructions:   Jorge Gibson is a subcutaneous injection that will lower your LDL cholesterol by 50%. There is a 2-shot loading dose given 3 months apart and then it is every 6 months after that.  *If you need a refill on your cardiac medications before your next appointment, please call your pharmacy*   Lab Work: FASTING lab work to check cholesterol in 5-6 months NMR lipoprofile and LPa -- complete about 1 week before next visit  If you have labs (blood work) drawn today and your tests are completely normal, you will receive your results only by: [place] (if you have MyChart) OR A paper copy in the mail If you have any lab test that is abnormal or we need to change your treatment, we will call you to review the results.   Follow-Up: At [facility], you and your health needs are our priority.  As part of our continuing mission to provide you with exceptional heart care, we have created designated Provider Care Teams.  These Care Teams include your primary Cardiologist (physician) and Advanced Practice Providers (APPs -  Physician Assistants and Nurse Practitioners) who all work together to provide you with the care you need, when you need it.  We recommend signing up for the patient portal called "MyChart".  Sign up information is provided on this After Visit Summary.  MyChart is used to connect with patients for Virtual Visits (Telemedicine).  Patients are able to view lab/test results, encounter notes, upcoming appointments, etc.  Non-urgent messages can be sent to your provider as well.   To learn more about what you can do with MyChart, go to [email or web address].    Your next appointment:    5-6 months with Dr. Debara Pickett

## 2022-11-29 NOTE — Progress Notes (Signed)
LIPID CLINIC CONSULT NOTE  Chief Complaint:  Manage dyslipidemia  Primary Care Physician: Ginger Organ., MD  Primary Cardiologist:  None  HPI:  Jorge Gibson is a 62 y.o. male who is being seen today for the evaluation of dyslipidemia at the request of Thukkani, Arlie Solomons, MD. this is a pleasant 63 year old male kindly referred for evaluation management of dyslipidemia.  His primary care provider had ordered a coronary calcium score which was markedly elevated at 56, 98th percentile for age and sex matched controls.  There is a family history of early heart disease in his father.  He has a personal history of tobacco use, hypertension and dyslipidemia and is noted also to have aortic atherosclerosis.  He was seen by Dr. Ali Lowe and has been on high intensity rosuvastatin 40 mg daily.  Lipids in August 2023 showed improved LDL down to 65 with elevated triglycerides at 243.  The lipids in March 2024 however showed an increase in total cholesterol to 148 with improvement in triglycerides to 140 but the LDL is now 90.  His target LDL would be less than 70.  In addition he had an LP(a) assessed which was elevated 128.4 nmol/L.  PMHx:  Past Medical History:  Diagnosis Date   Allergy    tree pollen   Atherosclerosis of aorta (HCC)    CAD (coronary artery disease)    COVID    Elevated coronary artery calcium score    Epicondylitis elbow, medial    Family history of early CAD    History of smoking    Hyperlipidemia    Hypertension    Pulmonary nodule     Past Surgical History:  Procedure Laterality Date   APPENDECTOMY     COLONOSCOPY  07/09/2022   CORNEAL TRANSPLANT     FINGER FRACTURE SURGERY      FAMHx:  Family History  Problem Relation Age of Onset   Cancer Mother    Cancer Father    Healthy Sister    Healthy Brother    Cancer Paternal Grandfather    Colon cancer Neg Hx    Colon polyps Neg Hx    Crohn's disease Neg Hx    Esophageal cancer Neg Hx    Rectal  cancer Neg Hx    Stomach cancer Neg Hx    Ulcerative colitis Neg Hx     SOCHx:   reports that he has never smoked. He has never been exposed to tobacco smoke. He has never used smokeless tobacco. He reports current alcohol use. He reports that he does not use drugs.  ALLERGIES:  No Known Allergies  ROS: Pertinent items noted in HPI and remainder of comprehensive ROS otherwise negative.  HOME MEDS: Current Outpatient Medications on File Prior to Visit  Medication Sig Dispense Refill   aspirin EC 81 MG tablet 1 tablet Orally Once a day     Chlorpheniramine Maleate (ALLERGY RELIEF PO) Take 10 mg by mouth as needed.     losartan (COZAAR) 50 MG tablet Take 1 tablet (50 mg total) by mouth daily. 90 tablet 3   rosuvastatin (CRESTOR) 40 MG tablet Take 1 tablet (40 mg total) by mouth daily. 90 tablet 3   No current facility-administered medications on file prior to visit.    LABS/IMAGING: No results found for this or any previous visit (from the past 48 hour(s)). No results found.  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   Mr. Prestage has significant cardiovascular risk factors and a very high calcium score.  He also has a high LP(a).  His LDL cholesterol  remains above target LDL of less than 70 on high-dose rosuvastatin 40 mg daily.  He needs additional therapy and would benefit from a PCSK9 inhibitor given his ASCVD to reach target LDL less than 70.  Would recommend pursuing prior authorization for PCSK9 inhibitor therapy.  We discussed options including antibody therapy and Leqvio.  He would prefer less frequent provider administered dosing with Leqvio although cost might be a prohibitive factor.  Will investigate this first however if Marion Downer is cost prohibitive then would recommend antibody PCSK9i therapy.  Thanks for the kind referral.  Follow-up with me in about 6 months.  Pixie Casino, MD, [facility], [place] Director of the Advanced Lipid Disorders &  Cardiovascular Risk Reduction Clinic Diplomate of the American Board of Clinical Lipidology Attending Cardiologist  Direct Dial: [phone]  Fax: [phone]  Website:  [email or web address] 11/29/2022, 10:47 AM

## 2022-11-29 NOTE — Telephone Encounter (Signed)
Patient seen in office 11/29/22. Consulted for lipid management. Patient would like to have a benefits check for leqvio. Form faxed to [facility] service center

## 2022-12-05 NOTE — Telephone Encounter (Signed)
Patient has been identified as candidate for Leqvio  Benefits investigation enrollment completed on 11/30/22  Benefits investigation report notes the following:  Type of insurance: Comptroller - Choice POS II Plan  OOP Max: $1500 / Met: $132.95  Deductible: $500   Co-insurance: 10%  PA required: Yes  PA phone number: not available  Benefits Summary Details:  Primary: Aetna - coverage subject to $500 deductible per calendar year; once met, leqvio is covered at 90% with 10% coinsurance until reaching $1500 OOP; once OOP max is met, leqvio is covered at 100%

## 2022-12-07 ENCOUNTER — Encounter: Payer: Self-pay | Admitting: Internal Medicine

## 2022-12-07 ENCOUNTER — Other Ambulatory Visit: Payer: No Typology Code available for payment source

## 2022-12-07 DIAGNOSIS — E785 Hyperlipidemia, unspecified: Secondary | ICD-10-CM | POA: Insufficient documentation

## 2022-12-07 NOTE — Addendum Note (Signed)
Addended by: Fidel Levy on: 12/07/2022 08:35 AM   Modules accepted: Orders

## 2022-12-07 NOTE — Telephone Encounter (Signed)
Patient would like to proceed w/leqvio. Referral ordered. Message sent to patient and [place]

## 2022-12-13 ENCOUNTER — Telehealth: Payer: Self-pay | Admitting: Internal Medicine

## 2022-12-13 NOTE — Telephone Encounter (Signed)
Patient is aware is only seeing the nurse for a blood pressure check. No need to fast

## 2022-12-13 NOTE — Telephone Encounter (Signed)
Patient wants to know if he will need to fast prior to his visit on Monday, 4/8.

## 2022-12-17 ENCOUNTER — Ambulatory Visit: Payer: No Typology Code available for payment source | Attending: Cardiovascular Disease | Admitting: *Deleted

## 2022-12-17 VITALS — BP 118/60 | HR 55 | Resp 20 | Wt 213.0 lb

## 2022-12-17 DIAGNOSIS — I10 Essential (primary) hypertension: Secondary | ICD-10-CM | POA: Diagnosis not present

## 2022-12-17 NOTE — Progress Notes (Signed)
   Nurse Visit   Date of Encounter: 12/17/2022 ID: HALLEY ELZA, DOB 1961/02/24, MRN [id number]  PCP:  Cleatis Polka., MD   [facility] Providers Cardiologist:  Dr. Carmina Miller {}     Visit Details   VS:  BP 118/60 (BP Location: Right Arm)   Pulse (!) 55   Resp 20   Wt 213 lb 0.4 oz (96.6 kg)   SpO2 99%   BMI 29.71 kg/m  , BMI Body mass index is 29.71 kg/m.  Wt Readings from Last 3 Encounters:  12/17/22 213 lb 0.4 oz (96.6 kg)  11/29/22 218 lb (98.9 kg)  11/12/22 224 lb 12.8 oz (102 kg)     Reason for visit: RN Visit  / BP check Performed today:  WT, Vitals, and Education Changes (medications, testing, etc.) : Increased Losartan to 50 mg daily. Length of Visit: 15 minutes    Medications Adjustments/Labs and Tests Ordered: Pt arrived at 1105 am on 12/17/2022 for his RN / BP check visit.  Pt weight obatined, and stated current medication list on file is correct.  Pt states he has had been asymptomatic with the Losartan medication increase.  Pt is now taking Losartan 50 mg daily.  Pt BP in Left arm was 122/64, and BP in Right arm was 118/60.  BP assessment was WNL.  Pt advised to continue taking as ordered, and follow Dr. Trula Ore plan of care.  Pt also advised to reach out to [facility] if he has new or worsening symptoms.  Pt verbalized understanding.      Signed, Elmore Guise, RN  12/17/2022 11:31 AM

## 2022-12-31 ENCOUNTER — Telehealth: Payer: Self-pay | Admitting: Pharmacy Technician

## 2022-12-31 NOTE — Telephone Encounter (Signed)
Jeani Hawking note:  Patient has been approved for Leqvio. Patient will be scheduled as soon as possible.  Auth Submission: APPROVED Site of care: Site of care: [facility] Payer: [employer] Medication & CPT/J Code(s) submitted: Leqvio (Inclisiran) [id number] Route of submission (phone, fax, portal):  Phone # Fax # Auth type: Buy/Bill Units/visits requested: 2 DOSES Reference number: [id number] Approval from: 05/12/23 to 06/10/23   [facility] co-pay card: approved Id: [id number] Bin: [id number] GrP: [id number] Pcn: [id number]

## 2023-01-04 ENCOUNTER — Ambulatory Visit
Admission: RE | Admit: 2023-01-04 | Discharge: 2023-01-04 | Disposition: A | Discharge status: Home / Self Care | Payer: No Typology Code available for payment source | Source: Ambulatory Visit | Attending: Internal Medicine | Admitting: Internal Medicine

## 2023-01-04 DIAGNOSIS — R911 Solitary pulmonary nodule: Secondary | ICD-10-CM

## 2023-01-04 NOTE — Telephone Encounter (Signed)
First injection 01/08/23 - second ~ 04/09/23 - f/u with MD 05/07/23

## 2023-01-08 ENCOUNTER — Ambulatory Visit: Payer: No Typology Code available for payment source

## 2023-01-08 VITALS — BP 152/77 | HR 66 | Temp 98.2°F | Resp 20 | Ht 71.0 in | Wt 209.8 lb

## 2023-01-08 DIAGNOSIS — E785 Hyperlipidemia, unspecified: Secondary | ICD-10-CM

## 2023-01-08 MED ORDER — INCLISIRAN SODIUM 284 MG/1.5ML ~~LOC~~ SOSY
284.0000 mg | PREFILLED_SYRINGE | Freq: Once | SUBCUTANEOUS | Status: AC
  Administered 2023-01-08: 284 mg via SUBCUTANEOUS
  Filled 2023-01-08: qty 1.5

## 2023-01-08 NOTE — Progress Notes (Signed)
Diagnosis: Hyperlipidemia  Provider:  Chilton Greathouse MD  Procedure: Injection  Leqvio, Dose: 284 mg, Site: subcutaneous, Number of injections: 1  Post Care: Observation period completed  Discharge: Condition: Good, Destination: Home . AVS Provided  Performed by:  Nat Math, RN

## 2023-01-08 NOTE — Patient Instructions (Signed)
Inclisiran Injection What is this medication? INCLISIRAN (in kli SIR an) treats high cholesterol. It works by decreasing bad cholesterol (such as LDL) in your blood. Changes to diet and exercise are often combined with this medication. This medicine may be used for other purposes; ask your health care provider or pharmacist if you have questions. COMMON BRAND NAME(S): LEQVIO What should I tell my care team before I take this medication? They need to know if you have any of these conditions: An unusual or allergic reaction to inclisiran, other medications, foods, dyes, or preservatives Pregnant or trying to get pregnant Breast-feeding How should I use this medication? This medication is injected under the skin. It is given by your care team in a hospital or clinic setting. Talk to your care team about the use of this medication in children. Special care may be needed. Overdosage: If you think you have taken too much of this medicine contact a poison control center or emergency room at once. NOTE: This medicine is only for you. Do not share this medicine with others. What if I miss a dose? Keep appointments for follow-up doses. It is important not to miss your dose. Call your care team if you are unable to keep an appointment. What may interact with this medication? Interactions are not expected. This list may not describe all possible interactions. Give your health care provider a list of all the medicines, herbs, non-prescription drugs, or dietary supplements you use. Also tell them if you smoke, drink alcohol, or use illegal drugs. Some items may interact with your medicine. What should I watch for while using this medication? Visit your care team for regular checks on your progress. Tell your care team if your symptoms do not start to get better or if they get worse. You may need blood work while you are taking this medication. What side effects may I notice from receiving this  medication? Side effects that you should report to your care team as soon as possible: Allergic reactions--skin rash, itching, hives, swelling of the face, lips, tongue, or throat Side effects that usually do not require medical attention (report these to your care team if they continue or are bothersome): Joint pain Pain, redness, or irritation at injection site This list may not describe all possible side effects. Call your doctor for medical advice about side effects. You may report side effects to FDA at 1-800-FDA-1088. Where should I keep my medication? This medication is given in a hospital or clinic. It will not be stored at home. NOTE: This sheet is a summary. It may not cover all possible information. If you have questions about this medicine, talk to your doctor, pharmacist, or health care provider.  2023 Elsevier/Gold Standard (2020-09-14 00:00:00)  

## 2023-02-06 ENCOUNTER — Other Ambulatory Visit: Payer: Self-pay | Admitting: Internal Medicine

## 2023-03-07 IMAGING — CT CT CARDIAC CORONARY ARTERY CALCIUM SCORE
3 series · 13 of 20 positions shown, 15 images · non-contrast
Comparison: None.

CLINICAL DATA: 60-year-old white male with family history of
coronary artery disease.

EXAM:
CT CARDIAC CORONARY ARTERY CALCIUM SCORE
TECHNIQUE: Non-contrast imaging through the heart was performed using
prospective ECG gating. Image post processing was performed on an
independent workstation, allowing for quantitative analysis of the
heart and coronary arteries. Note that this exam targets the heart
and the chest was not imaged in its entirety.

[Series 2: calcium scoring 2.00 qr36 bestdiast 70% hrt calciu · axial · 0.47mm/px · z∈[+1615,+1671]mm · 3 of 70 slices shown]
[im 14/70  vessel]
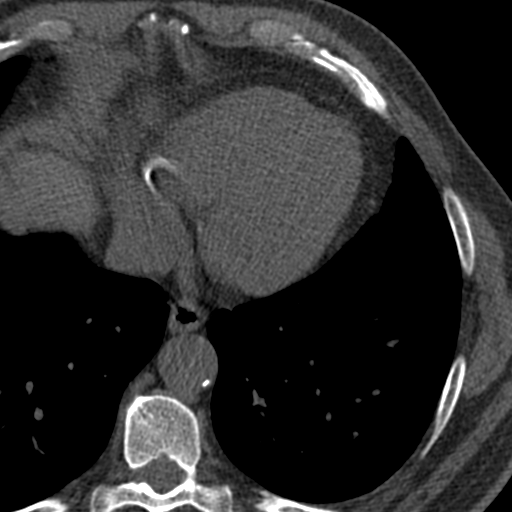
[im 28/70  vessel]
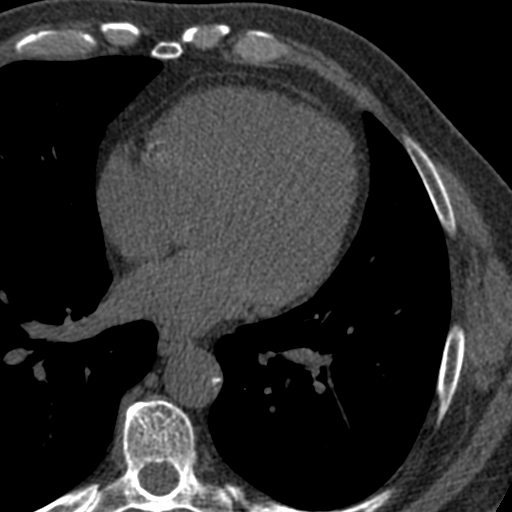
[im 42/70  vessel]
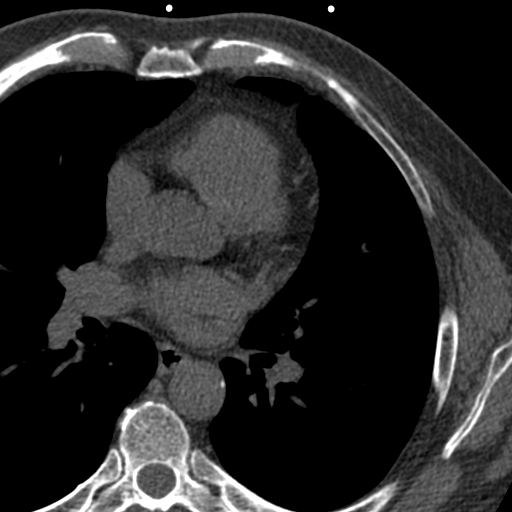

[Series 3: calcium scoring 2.00 br40 bestdiast 70% axial · axial · 0.63mm/px · z∈[+1611,+1703]mm · 5 of 70 slices shown, 7 images]
[im 12/70  vessel]
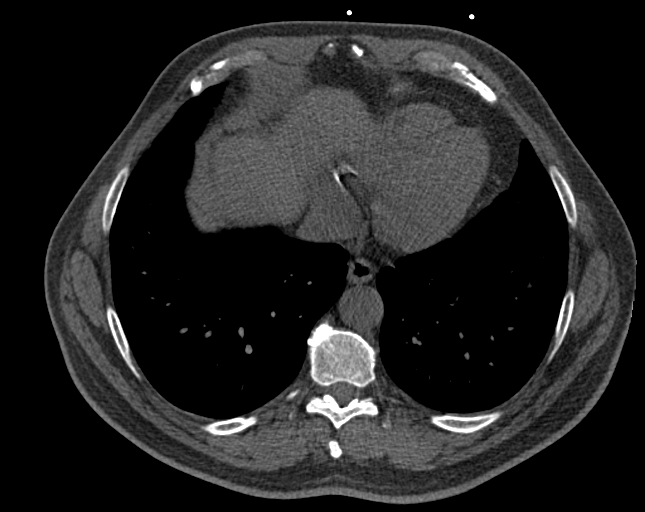
[im 12/70  lung]
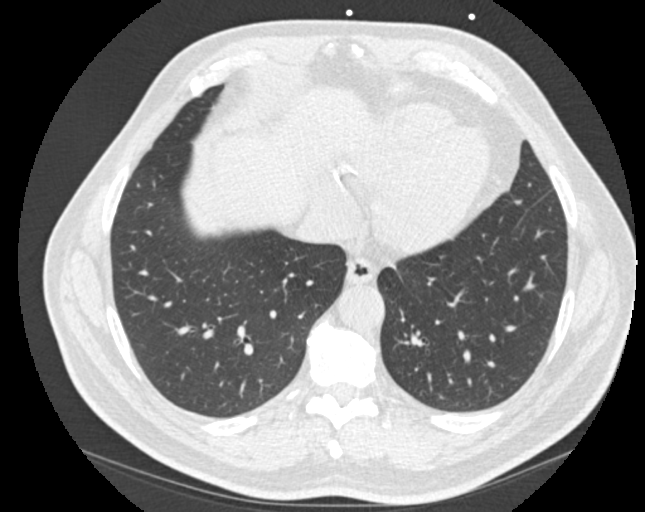
[im 24/70  vessel]
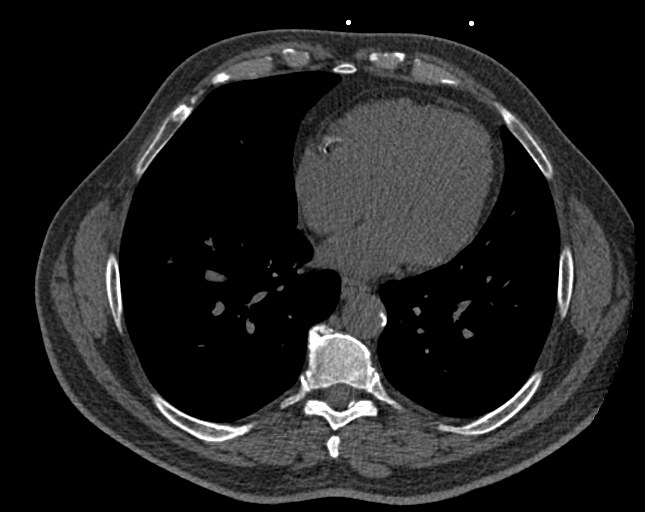
[im 35/70  vessel]
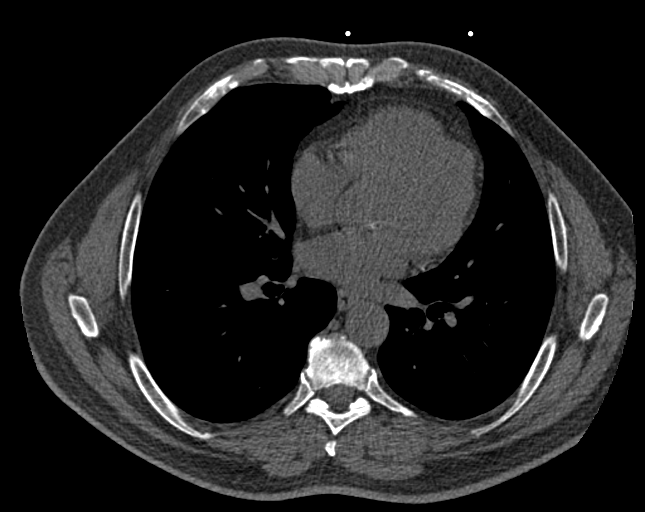
[im 47/70  vessel]
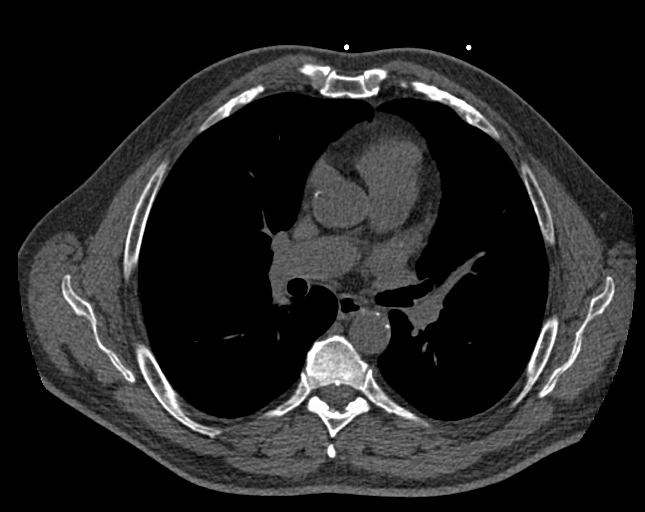
[im 58/70  vessel]
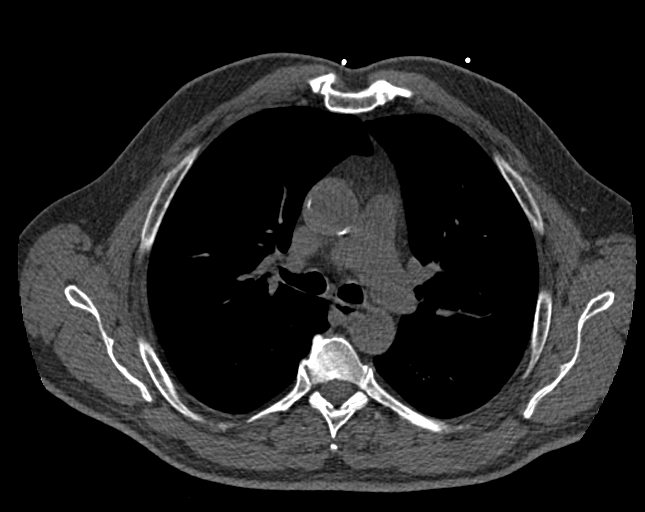
[im 58/70  lung]
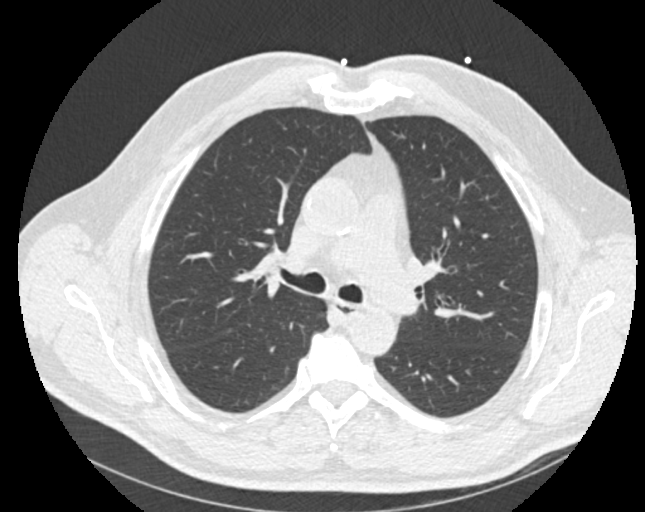

[Series 9: calcium scoring 2.00 br60 bestdiast 70% lungs · axial · 0.63mm/px · z∈[+1611,+1703]mm · 5 of 70 slices shown]
[im 12/70  vessel]
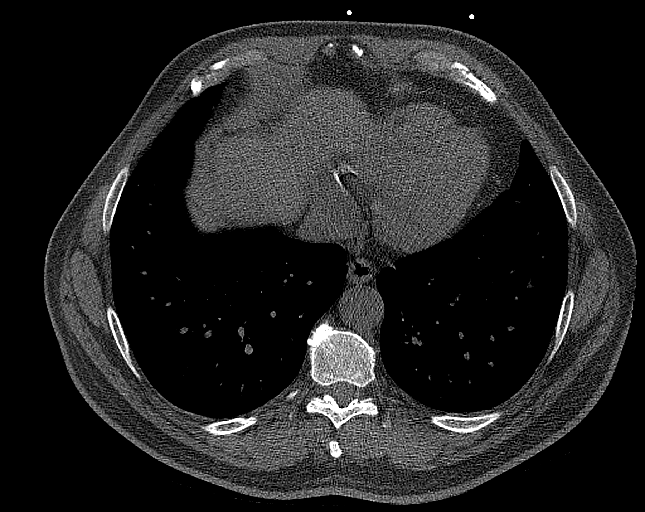
[im 24/70  vessel]
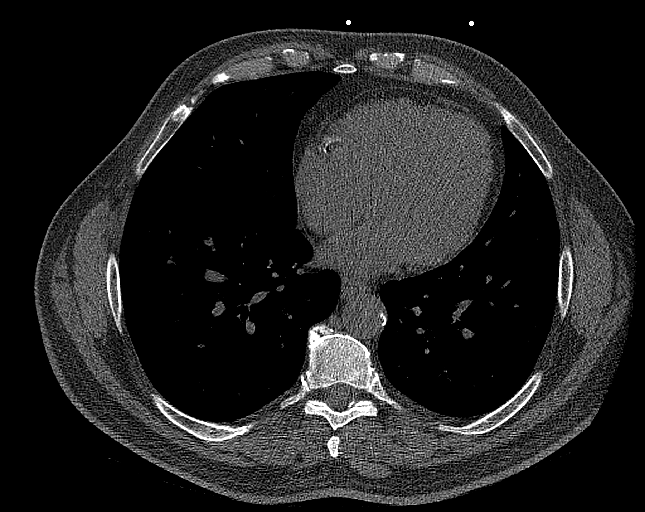
[im 35/70  vessel]
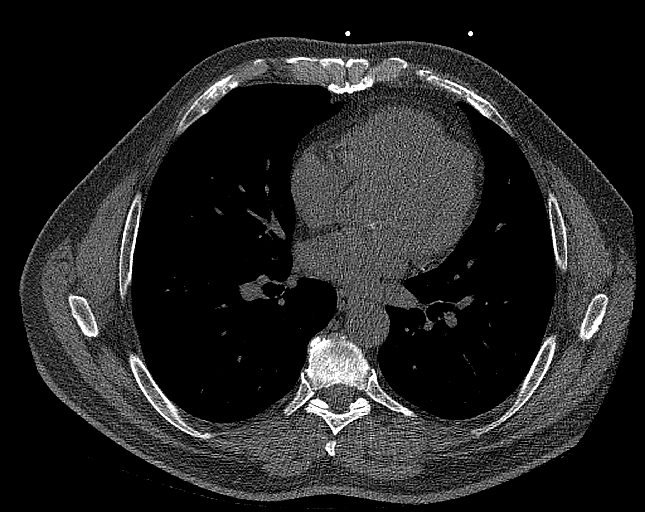
[im 47/70  vessel]
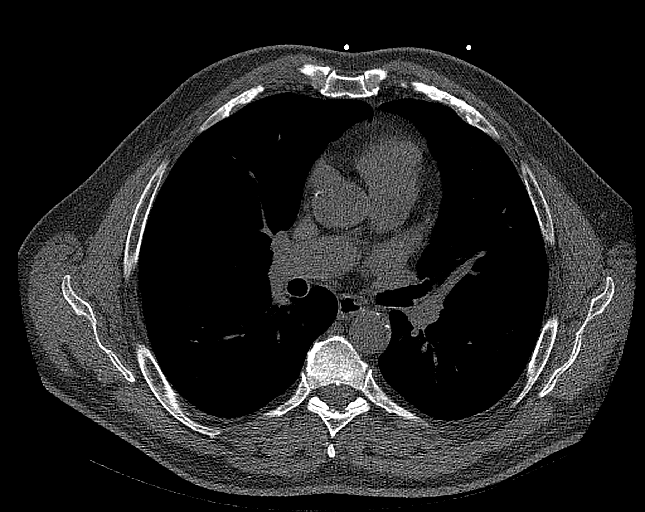
[im 58/70  vessel]
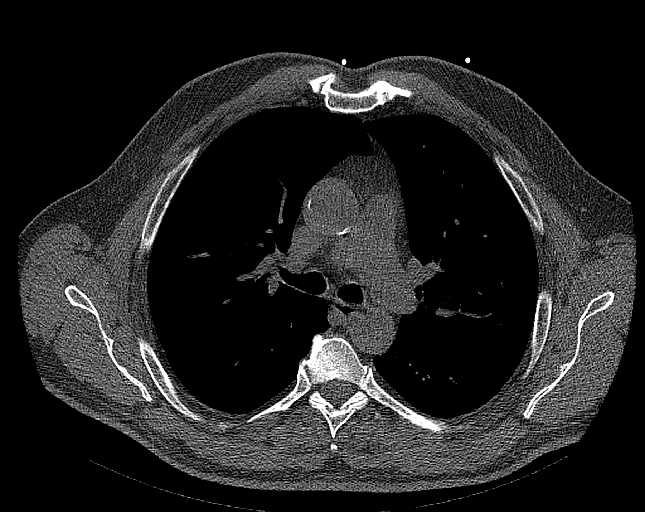

[13 of 20 positions shown; findings below may reference images not displayed]

FINDINGS: CORONARY CALCIUM SCORES:

Left Main:

LAD: 390

LCx: 51

RCA: 1111

Total Agatston Score: 6463

[HOSPITAL] percentile: 98

AORTA MEASUREMENTS:

Ascending Aorta: 34 mm

Descending Aorta: 27 mm

OTHER FINDINGS:

Study has technical limitations due to motion artifact particularly
involving the right coronary artery. There are diffuse
calcifications throughout the right coronary artery but the calcium
score is likely artificially elevated due to over sampling from the
motion artifact. Heart size is normal. No significant pericardial
effusion. Atherosclerotic calcifications in the thoracic aorta with
normal caliber. Visualized mediastinal structures are normal. Images
of the upper abdomen are unremarkable. 3 mm nodule along the right
major fissure on sequence 9 image 23. No significant airspace
disease or consolidation in the visualized lungs. No large pleural
effusions. Punctate peripheral nodule in the left upper lobe on
sequence 9 image 25. No acute bone abnormality.
IMPRESSION: 1. Calculated coronary calcium score is 6463 and this is at
percentile 98 for patients of the same age, gender and ethnicity.
Please note that the calcium score in the right coronary artery is
likely artificially elevated due to over sampling from the motion
artifact.
2.  Aortic Atherosclerosis ([id number].I).
3. Two small pulmonary nodules, largest measures 3 mm. No follow-up
needed if patient is low-risk (and has no known or suspected primary
neoplasm). Non-contrast chest CT can be considered in 12 months if
patient is high-risk. This recommendation follows the consensus
statement: Guidelines for Management of Incidental Pulmonary Nodules
Detected on CT Images: From the [HOSPITAL] 3627; Radiology

## 2023-04-09 ENCOUNTER — Ambulatory Visit (INDEPENDENT_AMBULATORY_CARE_PROVIDER_SITE_OTHER): Payer: No Typology Code available for payment source

## 2023-04-09 VITALS — BP 123/69 | HR 57 | Temp 97.7°F | Resp 18 | Ht 71.0 in | Wt 207.8 lb

## 2023-04-09 DIAGNOSIS — E785 Hyperlipidemia, unspecified: Secondary | ICD-10-CM

## 2023-04-09 MED ORDER — INCLISIRAN SODIUM 284 MG/1.5ML ~~LOC~~ SOSY
284.0000 mg | PREFILLED_SYRINGE | Freq: Once | SUBCUTANEOUS | Status: AC
  Administered 2023-04-09: 284 mg via SUBCUTANEOUS
  Filled 2023-04-09: qty 1.5

## 2023-04-09 NOTE — Progress Notes (Signed)
Diagnosis: Hyperlipidemia  Provider:  Chilton Greathouse MD  Procedure: Injection  Leqvio (inclisiran), Dose: 284 mg, Site: subcutaneous, Number of injections: 1  Post Care: Patient declined observation Left arm injection  Discharge: Condition: Good, Destination: Home . AVS Declined  Performed by:  Rico Ala, LPN

## 2023-05-07 ENCOUNTER — Encounter (HOSPITAL_BASED_OUTPATIENT_CLINIC_OR_DEPARTMENT_OTHER): Payer: Self-pay

## 2023-05-07 ENCOUNTER — Encounter (HOSPITAL_BASED_OUTPATIENT_CLINIC_OR_DEPARTMENT_OTHER): Payer: No Typology Code available for payment source | Admitting: Internal Medicine

## 2023-05-08 LAB — NMR, LIPOPROFILE: Small LDL Particle Number: 518 nmol/L (ref <=527)

## 2023-05-21 ENCOUNTER — Ambulatory Visit (HOSPITAL_BASED_OUTPATIENT_CLINIC_OR_DEPARTMENT_OTHER): Payer: No Typology Code available for payment source | Admitting: Internal Medicine

## 2023-05-21 ENCOUNTER — Encounter (HOSPITAL_BASED_OUTPATIENT_CLINIC_OR_DEPARTMENT_OTHER): Payer: Self-pay | Admitting: Internal Medicine

## 2023-05-21 VITALS — BP 140/64 | HR 69 | Ht 71.0 in | Wt 209.0 lb

## 2023-05-21 DIAGNOSIS — I2510 Atherosclerotic heart disease of native coronary artery without angina pectoris: Secondary | ICD-10-CM

## 2023-05-21 DIAGNOSIS — E7841 Elevated Lipoprotein(a): Secondary | ICD-10-CM

## 2023-05-21 DIAGNOSIS — E785 Hyperlipidemia, unspecified: Secondary | ICD-10-CM

## 2023-05-21 MED ORDER — LEQVIO 284 MG/1.5ML ~~LOC~~ SOSY: 284.0000 mg | PREFILLED_SYRINGE | Freq: Once | SUBCUTANEOUS | 11 refills | Status: AC

## 2023-05-21 NOTE — Patient Instructions (Signed)
Medication Instructions: Your physician recommends that you continue on your current medications as directed. Please refer to the Current Medication list given to you today.  Lab Work: Your physician recommends that you return for lab work one week before next appointment- fasting NMR  Follow-Up: At [facility], you and your health needs are our priority.  As part of our continuing mission to provide you with exceptional heart care, we have created designated Provider Care Teams.  These Care Teams include your primary Cardiologist (physician) and Advanced Practice Providers (APPs -  Physician Assistants and Nurse Practitioners) who all work together to provide you with the care you need, when you need it.  We recommend signing up for the patient portal called "MyChart".  Sign up information is provided on this After Visit Summary.  MyChart is used to connect with patients for Virtual Visits (Telemedicine).  Patients are able to view lab/test results, encounter notes, upcoming appointments, etc.  Non-urgent messages can be sent to your provider as well.   To learn more about what you can do with MyChart, go to [email or web address].    Your next appointment:   1 year with Dr. Rennis Golden in Lipid Clinic

## 2023-05-21 NOTE — Progress Notes (Signed)
LIPID CLINIC CONSULT NOTE  Chief Complaint:  Follow-up dyslipidemia  Primary Care Physician: Cleatis Polka., MD  Primary Cardiologist:  None  HPI:  Jorge Gibson is a 62 y.o. male who is being seen today for the evaluation of dyslipidemia at the request of Clelia Croft Netta Corrigan., MD. this is a pleasant 63 year old male kindly referred for evaluation management of dyslipidemia.  His primary care provider had ordered a coronary calcium score which was markedly elevated at 1706, 98th percentile for age and sex matched controls.  There is a family history of early heart disease in his father.  He has a personal history of tobacco use, hypertension and dyslipidemia and is noted also to have aortic atherosclerosis.  He was seen by Dr. Lynnette Caffey and has been on high intensity rosuvastatin 40 mg daily.  Lipids in August 2023 showed improved LDL down to 65 with elevated triglycerides at 243.  The lipids in March 2024 however showed an increase in total cholesterol to 148 with improvement in triglycerides to 140 but the LDL is now 90.  His target LDL would be less than 70.  In addition he had an LP(a) assessed which was elevated 128.4 nmol/L.  05/21/2023  Jorge Gibson is seen today in follow-up.  He has had an excellent response to Leqvio.  He is tolerating it well.  His LDL particle number is now 613, LDL 48, HDL 36 and triglycerides 161.  Moreover his LP(a) is come down from 128 to 69.  This is now near normal.  PMHx:  Past Medical History:  Diagnosis Date   Allergy    tree pollen   Atherosclerosis of aorta (HCC)    CAD (coronary artery disease)    COVID    Elevated coronary artery calcium score    Epicondylitis elbow, medial    Family history of early CAD    History of smoking    Hyperlipidemia    Hypertension    Pulmonary nodule     Past Surgical History:  Procedure Laterality Date   APPENDECTOMY     COLONOSCOPY  07/09/2022   CORNEAL TRANSPLANT     FINGER FRACTURE SURGERY       FAMHx:  Family History  Problem Relation Age of Onset   Cancer Mother    Cancer Father    Healthy Sister    Healthy Brother    Cancer Paternal Grandfather    Colon cancer Neg Hx    Colon polyps Neg Hx    Crohn's disease Neg Hx    Esophageal cancer Neg Hx    Rectal cancer Neg Hx    Stomach cancer Neg Hx    Ulcerative colitis Neg Hx     SOCHx:   reports that he has never smoked. He has never been exposed to tobacco smoke. He has never used smokeless tobacco. He reports current alcohol use. He reports that he does not use drugs.  ALLERGIES:  No Known Allergies  ROS: Pertinent items noted in HPI and remainder of comprehensive ROS otherwise negative.  HOME MEDS: Current Outpatient Medications on File Prior to Visit  Medication Sig Dispense Refill   aspirin EC 81 MG tablet 1 tablet Orally Once a day     Chlorpheniramine Maleate (ALLERGY RELIEF PO) Take 10 mg by mouth as needed.     losartan (COZAAR) 50 MG tablet Take 1 tablet (50 mg total) by mouth daily. 90 tablet 3   rosuvastatin (CRESTOR) 40 MG tablet TAKE 1 TABLET BY  MOUTH EVERY DAY 90 tablet 3   No current facility-administered medications on file prior to visit.    LABS/IMAGING: No results found for this or any previous visit (from the past 48 hour(s)). No results found.  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   Mr. Wagy has had an excellent response to [facility] therapy in addition to his statin.  LP(a) is come down to near normal at 76 nmol/L.  His LDL is at target now at 48.  He is asymptomatic despite a high calcium score.  I have encouraged him to continue to stay active and monitor for any symptoms.  Follow-up with me annually or sooner as necessary.  Chrystie Nose, MD, [facility], FACP  [place]  [facility]  Medical Director of the Advanced Lipid [facility] &  Cardiovascular Risk Reduction Clinic Diplomate of the American Board of Clinical Lipidology Attending Cardiologist  Direct Dial: [phone]  Fax: [phone]  Website:  [email or web address] Nicely Ewart Carrera 05/21/2023, 3:08 PM

## 2023-06-01 ENCOUNTER — Other Ambulatory Visit: Payer: Self-pay | Admitting: Internal Medicine

## 2023-08-07 NOTE — Progress Notes (Signed)
Erroneous encounter

## 2023-09-19 ENCOUNTER — Telehealth: Payer: Self-pay | Admitting: Pharmacy Technician

## 2023-09-19 NOTE — Telephone Encounter (Signed)
 Auth Submission: APPROVED Site of care: Site of care: [facility] Payer: aetna Medication & CPT/J Code(s) submitted: Leqvio  (Inclisiran) J1306 Route of submission (phone, fax, portal): portal Phone # Fax # Auth type: Buy/Bill PB Units/visits requested: 2 doses Reference number: [id number] Approval from: 09/18/23 to 09/17/24

## 2023-10-10 ENCOUNTER — Ambulatory Visit: Payer: No Typology Code available for payment source

## 2023-10-10 VITALS — BP 159/69 | HR 78 | Temp 98.4°F | Resp 16 | Ht 71.0 in | Wt 233.2 lb

## 2023-10-10 DIAGNOSIS — E785 Hyperlipidemia, unspecified: Secondary | ICD-10-CM

## 2023-10-10 MED ORDER — INCLISIRAN SODIUM 284 MG/1.5ML ~~LOC~~ SOSY
284.0000 mg | PREFILLED_SYRINGE | Freq: Once | SUBCUTANEOUS | Status: AC
  Administered 2023-10-10: 284 mg via SUBCUTANEOUS
  Filled 2023-10-10: qty 1.5

## 2023-10-10 NOTE — Progress Notes (Signed)
Diagnosis: Hyperlipidemia  Provider:  Chilton Greathouse MD  Procedure: Injection  Leqvio (inclisiran), Dose: 284 mg, Site: subcutaneous, Number of injections: 1  Injection Site(s): Left arm  Post Care: Observation period completed  Discharge: Condition: Good, Destination: Home . AVS Declined  Performed by:  Forrest Moron, RN

## 2023-10-31 NOTE — Progress Notes (Signed)
 Cardiology Office Note:  .   Date:  11/12/2023  ID:  Jorge Gibson, DOB 04-19-1961, MRN [id number] PCP: Cleatis Polka., MD  [facility] Providers Cardiologist:  Orbie Pyo, MD    History of Present Illness: .   Jorge Gibson is a 63 y.o. male  CAD-Coronary calcium score is 1706, HLD-LDL targe 48 LPL(a) has come down to 76, HTN, Family history of early CAD.  Patient comes in for regular f/u. 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 No chest pain,dyspnea, palpitations, edema.   ROS:    Studies Reviewed: Marland Kitchen    EKG Interpretation Date/Time:  Tuesday November 12 2023 09:22:40 EST Ventricular Rate:  64 PR Interval:  168 QRS Duration:  96 QT Interval:  404 QTC Calculation: 416 R Axis:   3  Text Interpretation: Normal sinus rhythm Normal ECG When compared with ECG of 14-Aug-2019 17:37, PREVIOUS ECG IS PRESENT Confirmed by Jacolyn Reedy [phone]) on 11/12/2023 9:27:36 AM    Prior CV Studies:    ETT 2023:   Good exercise capacity, achieved 10.6 METS   Hypertensive response to exercise, peak BP 224/87   Peak HR 151 bpm (94% max age predicted HR)   Up sloping ST depression was noted.  No stress EKG evidence of ischemia   TTE 2023:  1. Left ventricular ejection fraction, by estimation, is 60 to 65%. The  left ventricle has normal function. The left ventricle has no regional  wall motion abnormalities. There is mild left ventricular hypertrophy.  Left ventricular diastolic parameters  were normal.   2. Right ventricular systolic function is normal. The right ventricular  size is normal.   3. The mitral valve is normal in structure. Trivial mitral valve  regurgitation. No evidence of mitral stenosis.   4. The aortic valve is tricuspid. Aortic valve regurgitation is not  visualized. Aortic valve sclerosis is present, with no evidence of aortic  valve stenosis.   5. The inferior  vena cava is normal in size with greater than 50%  respiratory variability, suggesting right atrial pressure of 3 mmHg.    CT 2023 Left Main: 98.2 LAD: 390 LCx: 51 RCA: 1166 Total Agatston Score: 1706 -1. Calculated coronary calcium score is 1706 and this is at percentile 98 for patients of the same age, gender and ethnicity. Please note that the calcium score in the right coronary artery is likely artificially elevated due to over sampling from the motion artifact. -2.  Aortic Atherosclerosis (ICD10-I70.0).  Risk Assessment/Calculations:             Physical Exam:   VS:  BP 120/60   Pulse 84   Ht 5\' 11"  (1.803 m)   Wt 226 lb (102.5 kg)   SpO2 97%   BMI 31.52 kg/m    Wt Readings from Last 3 Encounters:  11/12/23 226 lb (102.5 kg)  10/10/23 233 lb 3.2 oz (105.8 kg)  05/21/23 209 lb (94.8 kg)    GEN: Well nourished, well developed in no acute distress NECK: No JVD; No carotid bruits CARDIAC:  RRR, no murmurs, rubs, gallops RESPIRATORY:  Clear to auscultation without rales, wheezing or rhonchi  ABDOMEN: Soft, non-tender, non-distended EXTREMITIES:  No edema; No deformity   ASSESSMENT AND PLAN: .     CAD-Coronary calcium score is 1706-on ASA, Leqvio/crestor. BP and lipids well controlled. 150 min exercise weekly.  HLD-LDL targe 48 LPL(a) has come  down to 76  HTN-BP well controlled on losartan. Check labs today  Family history of early CAD-risk factor modification  Bilateral carotid bruits-check dopplers        Dispo: f/u in 1 yr.  Signed, Jacolyn Reedy, PA-C

## 2023-11-01 ENCOUNTER — Other Ambulatory Visit: Payer: Self-pay | Admitting: Internal Medicine

## 2023-11-12 ENCOUNTER — Encounter: Payer: Self-pay | Admitting: Physician Assistant

## 2023-11-12 ENCOUNTER — Ambulatory Visit: Payer: No Typology Code available for payment source | Attending: Physician Assistant | Admitting: Physician Assistant

## 2023-11-12 ENCOUNTER — Other Ambulatory Visit: Payer: Self-pay

## 2023-11-12 VITALS — BP 120/60 | HR 84 | Ht 71.0 in | Wt 226.0 lb

## 2023-11-12 DIAGNOSIS — I10 Essential (primary) hypertension: Secondary | ICD-10-CM

## 2023-11-12 DIAGNOSIS — I2510 Atherosclerotic heart disease of native coronary artery without angina pectoris: Secondary | ICD-10-CM

## 2023-11-12 DIAGNOSIS — R0989 Other specified symptoms and signs involving the circulatory and respiratory systems: Secondary | ICD-10-CM | POA: Diagnosis not present

## 2023-11-12 DIAGNOSIS — E785 Hyperlipidemia, unspecified: Secondary | ICD-10-CM

## 2023-11-12 NOTE — Patient Instructions (Signed)
 Lab Work: CBC, CMET If you have labs (blood work) drawn today and your tests are completely normal, you will receive your results only by: MyChart Message (if you have MyChart) OR A paper copy in the mail If you have any lab test that is abnormal or we need to change your treatment, we will call you to review the results.   Testing/Procedures: Carotid Ultrasound   Follow-Up: At [facility], you and your health needs are our priority.  As part of our continuing mission to provide you with exceptional heart care, we have created designated Provider Care Teams.  These Care Teams include your primary Cardiologist (physician) and Advanced Practice Providers (APPs -  Physician Assistants and Nurse Practitioners) who all work together to provide you with the care you need, when you need it.  We recommend signing up for the patient portal called "MyChart".  Sign up information is provided on this After Visit Summary.  MyChart is used to connect with patients for Virtual Visits (Telemedicine).  Patients are able to view lab/test results, encounter notes, upcoming appointments, etc.  Non-urgent messages can be sent to your provider as well.   To learn more about what you can do with MyChart, go to [email or web address].    Your next appointment:   1 year(s)  Provider:   Orbie Pyo, MD     Other Instructions   1st Floor: - Lobby - Registration  - Pharmacy  - Lab - Cafe  2nd Floor: - PV Lab - Diagnostic Testing (echo, CT, nuclear med)  3rd Floor: - Vacant  4th Floor: - TCTS (cardiothoracic surgery) - AFib Clinic - Structural Heart Clinic - Vascular Surgery  - Vascular Ultrasound  5th Floor: - HeartCare Cardiology (general and EP) - Clinical Pharmacy for coumadin, hypertension, lipid, weight-loss medications, and med management appointments    Valet parking services will be available as well.

## 2023-11-13 LAB — COMPREHENSIVE METABOLIC PANEL
ALT: 28 IU/L (ref 0–44)
AST: 21 IU/L (ref 0–40)
Albumin: 4.9 g/dL (ref 3.9–4.9)
Alkaline Phosphatase: 78 IU/L (ref 44–121)
BUN/Creatinine Ratio: 22 (ref 10–24)
BUN: 23 mg/dL (ref 8–27)
Bilirubin Total: 0.6 mg/dL (ref 0.0–1.2)
CO2: 23 mmol/L (ref 20–29)
Calcium: 9.4 mg/dL (ref 8.6–10.2)
Chloride: 102 mmol/L (ref 96–106)
Creatinine, Ser: 1.06 mg/dL (ref 0.76–1.27)
Globulin, Total: 2.3 g/dL (ref 1.5–4.5)
Glucose: 94 mg/dL (ref 70–99)
Potassium: 4.9 mmol/L (ref 3.5–5.2)
Sodium: 140 mmol/L (ref 134–144)
Total Protein: 7.2 g/dL (ref 6.0–8.5)
eGFR: 79 mL/min/{1.73_m2} (ref >59)

## 2023-11-13 LAB — CBC
Hematocrit: 45 % (ref 37.5–51.0)
Hemoglobin: 14.9 g/dL (ref 13.0–17.7)
MCH: 30.2 pg (ref 26.6–33.0)
MCHC: 33.1 g/dL (ref 31.5–35.7)
MCV: 91 fL (ref 79–97)
Platelets: 232 10*3/uL (ref 150–450)
RBC: 4.94 x10E6/uL (ref 4.14–5.80)
RDW: 13 % (ref 11.6–15.4)
WBC: 6.8 10*3/uL (ref 3.4–10.8)

## 2023-12-02 ENCOUNTER — Ambulatory Visit (HOSPITAL_COMMUNITY)
Admission: RE | Admit: 2023-12-02 | Discharge: 2023-12-02 | Disposition: A | Discharge status: Home / Self Care | Source: Ambulatory Visit | Attending: Physician Assistant | Admitting: Physician Assistant

## 2023-12-02 DIAGNOSIS — R0989 Other specified symptoms and signs involving the circulatory and respiratory systems: Secondary | ICD-10-CM | POA: Diagnosis present

## 2023-12-04 ENCOUNTER — Telehealth: Payer: Self-pay

## 2023-12-04 DIAGNOSIS — I2510 Atherosclerotic heart disease of native coronary artery without angina pectoris: Secondary | ICD-10-CM

## 2023-12-04 DIAGNOSIS — R42 Dizziness and giddiness: Secondary | ICD-10-CM

## 2023-12-04 DIAGNOSIS — R0989 Other specified symptoms and signs involving the circulatory and respiratory systems: Secondary | ICD-10-CM

## 2023-12-04 NOTE — Telephone Encounter (Signed)
-----   Message from Laurann Montana sent at 12/03/2023  7:58 AM EDT ----- Chrisandra Netters, I am covering Lyda Perone box this week and Tresa Endo had told me to send her results to you. Please let patient know that study showed mild plaque in the right carotid and moderate in the left carotid, with some resistant flow on the right side. The carotid dimensions are not at a point any intervention is needed but this will need to be followed going forward. The reading physician recommends a follow-up study in 12 months, would arrange under Michele's name please. For the vertebral artery resistant flow, if he is having any symptoms of dizziness, would refer to neurology. Otherwise patient is already on cholesterol lowering medicine. Thanks!

## 2023-12-04 NOTE — Telephone Encounter (Addendum)
 Reviewed results with patient who verbalized understanding. Per Ronie Spies, PA-C to the reading physician recommends a follow-up study in 12 months carotid. Order has been placed in Epic. Patient also states that he has been having dizziness and per Ronie Spies, PA-C, I will put ina referral to neurology. Patient thanked me for the call and didn't have any further questions.

## 2023-12-06 ENCOUNTER — Encounter: Payer: Self-pay | Admitting: Internal Medicine

## 2024-01-14 ENCOUNTER — Other Ambulatory Visit: Payer: Self-pay | Admitting: Internal Medicine

## 2024-01-14 DIAGNOSIS — R911 Solitary pulmonary nodule: Secondary | ICD-10-CM

## 2024-01-24 ENCOUNTER — Ambulatory Visit
Admission: RE | Admit: 2024-01-24 | Discharge: 2024-01-24 | Disposition: A | Discharge status: Home / Self Care | Source: Ambulatory Visit | Attending: Internal Medicine | Admitting: Internal Medicine

## 2024-01-24 DIAGNOSIS — R911 Solitary pulmonary nodule: Secondary | ICD-10-CM

## 2024-01-30 ENCOUNTER — Other Ambulatory Visit: Payer: Self-pay | Admitting: Internal Medicine

## 2024-02-11 ENCOUNTER — Ambulatory Visit: Admitting: Diagnostic Neuroimaging

## 2024-02-11 ENCOUNTER — Telehealth: Payer: Self-pay | Admitting: Diagnostic Neuroimaging

## 2024-02-11 ENCOUNTER — Encounter: Payer: Self-pay | Admitting: Diagnostic Neuroimaging

## 2024-02-11 VITALS — BP 142/78 | HR 66 | Ht 71.0 in | Wt 233.0 lb

## 2024-02-11 DIAGNOSIS — R42 Dizziness and giddiness: Secondary | ICD-10-CM | POA: Diagnosis not present

## 2024-02-11 NOTE — Progress Notes (Signed)
 [facility]  PATIENT: Jorge Gibson DOB: Sep 06, 1961  REFERRING CLINICIAN: Flo Hummingbird, PA-C HISTORY FROM: patient  REASON FOR VISIT: new consult   HISTORICAL  CHIEF COMPLAINT:  Chief Complaint  Patient presents with   New Patient (Initial Visit)    Pt alone, rm 7. Pt states that if he laying in bed and turns head quickly he has dizziness. States when he gets up it occurs. This has been ongoing since he had covid in early 2020. States that doesn't occur when standing in walking.     HISTORY OF PRESENT ILLNESS:   63 year old male here for evaluation of positional dizziness.  Patient had COVID infection in 2020 with 1 week hospitalization and severe symptoms for a few months afterwards.  Following this he noticed some dizziness when he would turn his head to the right lay down or sit up.  Symptoms have been persistent and progressive over time.  He may have 20 to 30 seconds of dizziness and spinning sensation.  No nausea or vomiting.  Symptoms resolved by moving slowly and carefully.  Symptoms are happening on a daily basis.    REVIEW OF SYSTEMS: Full 14 system review of systems performed and negative with exception of: as per HPI.  ALLERGIES: No Known Allergies  HOME MEDICATIONS: Outpatient Medications Prior to Visit  Medication Sig Dispense Refill   aspirin EC 81 MG tablet 1 tablet Orally Once a day     Chlorpheniramine Maleate (ALLERGY RELIEF PO) Take 10 mg by mouth as needed.     inclisiran (LEQVIO ) 284 MG/1.5ML SOSY injection Inject 284 mg into the skin every 6 (six) months.     losartan  (COZAAR ) 50 MG tablet TAKE 1 TABLET BY MOUTH EVERY DAY 90 tablet 2   rosuvastatin  (CRESTOR ) 40 MG tablet TAKE 1 TABLET BY MOUTH EVERY DAY 90 tablet 3   No facility-administered medications prior to visit.    PAST MEDICAL HISTORY: Past Medical History:  Diagnosis Date   Allergy    tree pollen   Atherosclerosis of aorta (HCC)    CAD (coronary artery  disease)    COVID 2020   Elevated coronary artery calcium  score    Epicondylitis elbow, medial    Family history of early CAD    History of smoking    Hyperlipidemia    Hypertension    Pulmonary nodule     PAST SURGICAL HISTORY: Past Surgical History:  Procedure Laterality Date   APPENDECTOMY     COLONOSCOPY  07/09/2022   CORNEAL TRANSPLANT     FINGER FRACTURE SURGERY      FAMILY HISTORY: Family History  Problem Relation Age of Onset   Cancer Mother    Cancer Father    Healthy Sister    Healthy Brother    Cancer Paternal Grandfather    Colon cancer Neg Hx    Colon polyps Neg Hx    Crohn's disease Neg Hx    Esophageal cancer Neg Hx    Rectal cancer Neg Hx    Stomach cancer Neg Hx    Ulcerative colitis Neg Hx     SOCIAL HISTORY: Social History   Socioeconomic History   Marital status: Divorced    Spouse name: Not on file   Number of children: Not on file   Years of education: Not on file   Highest education level: Not on file  Occupational History   Not on file  Tobacco Use   Smoking status: Former    Current packs/day:  0.00    Types: Cigarettes    Quit date: 2014    Years since quitting: 11.4    Passive exposure: Never   Smokeless tobacco: Never  Vaping Use   Vaping status: Never Used  Substance and Sexual Activity   Alcohol  use: Yes    Comment: rarely   Drug use: Never   Sexual activity: Yes  Other Topics Concern   Not on file  Social History Narrative   Not on file   Social Drivers of Health   Financial Resource Strain: Low Risk  (07/31/2023)   Received from [facility]   Overall Financial Resource Strain (CARDIA)    Difficulty of Paying Living Expenses: Not hard at all  Food Insecurity: No Food Insecurity (07/31/2023)   Received from [facility]   Hunger Vital Sign    Worried About Running Out of Food in the Last Year: Never true    Ran Out of Food in the Last Year: Never true  Transportation Needs:  No Transportation Needs (07/31/2023)   Received from [facility] - Transportation    In the past 12 months, has lack of transportation kept you from medical appointments or from getting medications?: No    Lack of Transportation (Non-Medical): No  Physical Activity: Inactive (11/29/2022)   Exercise Vital Sign    Days of Exercise per Week: 0 days    Minutes of Exercise per Session: 0 min  Stress: Not on file  Social Connections: Not on file  Intimate Partner Violence: Not on file     PHYSICAL EXAM  GENERAL EXAM/CONSTITUTIONAL: Vitals:  Vitals:   02/11/24 0919  BP: (!) 142/78  Pulse: 66  Weight: 233 lb (105.7 kg)  Height: 5\' 11"  (1.803 m)   Body mass index is 32.5 kg/m. Wt Readings from Last 3 Encounters:  02/11/24 233 lb (105.7 kg)  11/12/23 226 lb (102.5 kg)  10/10/23 233 lb 3.2 oz (105.8 kg)  Orthostatic VS for the past 24 hrs (Last 3 readings):  BP- Lying Pulse- Lying BP- Standing at 0 minutes Pulse- Standing at 0 minutes BP- Standing at 3 minutes Pulse- Standing at 3 minutes  02/11/24 0918 142/78 66 132/82 69 142/78 66    Patient is in no distress; well developed, nourished and groomed; neck is supple  CARDIOVASCULAR: Examination of carotid arteries is normal; no carotid bruits Regular rate and rhythm, no murmurs Examination of peripheral vascular system by observation and palpation is normal  EYES: Ophthalmoscopic exam of optic discs and posterior segments is normal; no papilledema or hemorrhages No results found.  MUSCULOSKELETAL: Gait, strength, tone, movements noted in Neurologic exam below  NEUROLOGIC: MENTAL STATUS:      No data to display         awake, alert, oriented to person, place and time recent and remote memory intact normal attention and concentration language fluent, comprehension intact, naming intact fund of knowledge appropriate  CRANIAL NERVE:  2nd - no papilledema on fundoscopic exam 2nd, 3rd, 4th,  6th - pupils equal and reactive to light, visual fields full to confrontation, extraocular muscles intact, no nystagmus 5th - facial sensation symmetric 7th - facial strength symmetric 8th - hearing intact 9th - palate elevates symmetrically, uvula midline 11th - shoulder shrug symmetric 12th - tongue protrusion midline DIX-HALLPIKE TESTING WITH HEAD TURNED TO RIGHT REPRODUCE SYMPTOMS X 10 SECONDS BOTH ON LAYING DOWN AND SITTING UPPER, BUT NO NYSTAGMUS NOTED; HEAD TURNED LEFT DO NOT TRIGGER  SYMPTOMS  MOTOR:  normal bulk and tone, full strength in the BUE, BLE  SENSORY:  normal and symmetric to light touch, temperature, vibration  COORDINATION:  finger-nose-finger, fine finger movements normal  REFLEXES:  deep tendon reflexes 1+ and symmetric  GAIT/STATION:  narrow based gait     DIAGNOSTIC DATA (LABS, IMAGING, TESTING) - I reviewed patient records, labs, notes, testing and imaging myself where available.  Lab Results  Component Value Date   WBC 6.8 11/12/2023   HGB 14.9 11/12/2023   HCT 45.0 11/12/2023   MCV 91 11/12/2023   PLT 232 11/12/2023      Component Value Date/Time   NA 140 11/12/2023 1003   K 4.9 11/12/2023 1003   CL 102 11/12/2023 1003   CO2 23 11/12/2023 1003   GLUCOSE 94 11/12/2023 1003   GLUCOSE 101 (H) 08/19/2019 0445   BUN 23 11/12/2023 1003   CREATININE 1.06 11/12/2023 1003   CALCIUM  9.4 11/12/2023 1003   PROT 7.2 11/12/2023 1003   ALBUMIN 4.9 11/12/2023 1003   AST 21 11/12/2023 1003   ALT 28 11/12/2023 1003   ALKPHOS 78 11/12/2023 1003   BILITOT 0.6 11/12/2023 1003   GFRNONAA >60 08/19/2019 0445   GFRAA >60 08/19/2019 0445   Lab Results  Component Value Date   CHOL 148 11/19/2022   HDL 33 (L) 11/19/2022   LDLCALC 90 11/19/2022   TRIG 140 11/19/2022   CHOLHDL 4.5 11/19/2022   No results found for: "HGBA1C" No results found for: "VITAMINB12" No results found for: "TSH"  12/02/23  Right Carotid: Velocities in the right ICA are  consistent with a 1-39%  stenosis.   Left Carotid: Velocities in the left ICA are consistent with a 40-59%  stenosis.   Vertebrals: Bilateral vertebral arteries demonstrate antegrade flow.  Right  vertebral artery demonstrates high resistant flow.  Subclavians: Normal flow hemodynamics were seen in bilateral subclavian               arteries.   12/26/21 TTE  1. Left ventricular ejection fraction, by estimation, is 60 to 65%. The  left ventricle has normal function. The left ventricle has no regional  wall motion abnormalities. There is mild left ventricular hypertrophy.  Left ventricular diastolic parameters  were normal.   2. Right ventricular systolic function is normal. The right ventricular  size is normal.   3. The mitral valve is normal in structure. Trivial mitral valve  regurgitation. No evidence of mitral stenosis.   4. The aortic valve is tricuspid. Aortic valve regurgitation is not  visualized. Aortic valve sclerosis is present, with no evidence of aortic  valve stenosis.   5. The inferior vena cava is normal in size with greater than 50%  respiratory variability, suggesting right atrial pressure of 3 mmHg.     ASSESSMENT AND PLAN  63 y.o. year old male here with:   Dx:  1. Vertigo   2. Orthostatic dizziness     PLAN:  POSITIONAL VERTIGO (since ~ 2020 COVID infx; progressively worsening; ddx: post-viral labryinthitis, benign positional vertigo, vertebrobasilar insuffiency) - check MRI brain / IAC w/wo - refer to vestibular PT  Orders Placed This Encounter  Procedures   MR BRAIN/IAC W WO CONTRAST   MR ANGIO HEAD WO CONTRAST   MR ANGIO NECK W WO CONTRAST   Ambulatory referral to Physical Therapy   Return for pending if symptoms worsen or fail to improve, pending test results.    Omega Bible, MD 02/11/2024, 9:53 AM Certified  in Neurology, Neurophysiology and Neuroimaging  [facility] [address] [place], [place]  [address] [phone]

## 2024-02-11 NOTE — Telephone Encounter (Signed)
 sent to GI they obtain Drucie Opitz [phone]

## 2024-02-11 NOTE — Patient Instructions (Signed)
-   check MRI brain / IAC w/wo  - refer to vestibular PT

## 2024-02-17 ENCOUNTER — Other Ambulatory Visit: Payer: Self-pay

## 2024-02-17 ENCOUNTER — Ambulatory Visit: Attending: Diagnostic Neuroimaging

## 2024-02-17 DIAGNOSIS — R42 Dizziness and giddiness: Secondary | ICD-10-CM | POA: Diagnosis present

## 2024-02-17 DIAGNOSIS — H8111 Benign paroxysmal vertigo, right ear: Secondary | ICD-10-CM | POA: Diagnosis present

## 2024-02-17 NOTE — Therapy (Signed)
 OUTPATIENT PHYSICAL THERAPY VESTIBULAR EVALUATION     Patient Name: Jorge Gibson MRN: [id number] DOB:03-02-1961, 63 y.o., male Today's Date: 02/17/2024  END OF SESSION:  PT End of Session - 02/17/24 1615     Visit Number 1    Number of Visits 6    Date for PT Re-Evaluation 03/30/24    Authorization Type Aetna [place] Preferred    PT Start Time 1615    PT Stop Time 1700    PT Time Calculation (min) 45 min             Past Medical History:  Diagnosis Date   Allergy    tree pollen   Atherosclerosis of aorta (HCC)    CAD (coronary artery disease)    COVID 2020   Elevated coronary artery calcium  score    Epicondylitis elbow, medial    Family history of early CAD    History of smoking    Hyperlipidemia    Hypertension    Pulmonary nodule    Past Surgical History:  Procedure Laterality Date   APPENDECTOMY     COLONOSCOPY  07/09/2022   CORNEAL TRANSPLANT     FINGER FRACTURE SURGERY     Patient Active Problem List   Diagnosis Date Noted   Dyslipidemia 12/07/2022   CAP (community acquired pneumonia) 08/14/2019    PCP: Algernon Antigua, MD REFERRING PROVIDER: Omega Bible, MD  REFERRING DIAG: R42 (ICD-10-CM) - Vertigo R42 (ICD-10-CM) - Orthostatic dizziness  THERAPY DIAG:  Dizziness and giddiness  BPPV (benign paroxysmal positional vertigo), right  ONSET DATE: worse over past year  Rationale for Evaluation and Treatment: Rehabilitation  SUBJECTIVE:   SUBJECTIVE STATEMENT: When laying down and turning right feeling dizzy/spinning 10-20 sec. Notes this occurs when tipping the head back as well. Had COVID in 2020 with bad experience of weakness, dizziness, and poor balance.  The positional dizziness has been present for about a year with increased intensity. 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 Pt accompanied by: self  PERTINENT HISTORY: hx of cornea  transplants,   PAIN:  Are you having pain? No  PRECAUTIONS: None  RED FLAGS: None   WEIGHT BEARING RESTRICTIONS: No  FALLS: Has patient fallen in last 6 months? Yes. Number of falls notes a few slips and trips when doing more vigorous activities such as walking in the woods.  LIVING ENVIRONMENT: Lives with: lives with their spouse Lives in: house Stairs: stairs, no issues Has following equipment at home: None  PLOF: Independent  PATIENT GOALS: eliminate symptoms  OBJECTIVE:  Note: Objective measures were completed at Evaluation unless otherwise noted.  DIAGNOSTIC FINDINGS: n/a  COGNITION: Overall cognitive status: Within functional limits for tasks assessed   SENSATION: WFL  POSTURE:  No Significant postural limitations  Cervical ROM:    Active A/PROM (deg) eval  Flexion WFL  Extension WFL  Right lateral flexion WFL  Left lateral flexion WFL  Right rotation WFL  Left rotation WFL  (Blank rows = not tested)  STRENGTH:   LOWER EXTREMITY MMT:   MMT Right eval Left eval  Hip flexion    Hip abduction    Hip adduction    Hip internal rotation    Hip external rotation    Knee flexion    Knee extension    Ankle dorsiflexion    Ankle plantarflexion    Ankle inversion    Ankle eversion    (Blank  rows = not tested)  VESTIBULAR ASSESSMENT:  GENERAL OBSERVATION:    SYMPTOM BEHAVIOR:  Subjective history: one year worsening positional dizziness  Non-Vestibular symptoms: tinnitus  Type of dizziness: Spinning/Vertigo  Frequency: daily w/ position change  Duration: 10-20 sec  Aggravating factors: Induced by position change: lying supine, rolling to the right, and supine to sit  Relieving factors: slow movements  Progression of symptoms: worse  OCULOMOTOR EXAM:  Ocular Alignment: normal  Ocular ROM: No Limitations  Spontaneous Nystagmus: absent  Gaze-Induced Nystagmus: absent  Smooth Pursuits: intact  Saccades: intact  Convergence/Divergence: 3  cm     VESTIBULAR - OCULAR REFLEX:   Slow VOR: Normal  VOR Cancellation: Normal  Head-Impulse Test: NT  Dynamic Visual Acuity: Not able to be assessed   POSITIONAL TESTING: Right Dix-Hallpike: upbeating, right nystagmus Left Dix-Hallpike: no nystagmus Right Roll Test: no nystagmus Left Roll Test: no nystagmus  MOTION SENSITIVITY:  Motion Sensitivity Quotient Intensity: 0 = none, 1 = Lightheaded, 2 = Mild, 3 = Moderate, 4 = Severe, 5 = Vomiting  Intensity  1. Sitting to supine   2. Supine to L side   3. Supine to R side   4. Supine to sitting   5. L Hallpike-Dix   6. Up from L    7. R Hallpike-Dix   8. Up from R    9. Sitting, head tipped to L knee   10. Head up from L knee   11. Sitting, head tipped to R knee   12. Head up from R knee   13. Sitting head turns x5   14.Sitting head nods x5   15. In stance, 180 turn to L    16. In stance, 180 turn to R     OTHOSTATICS: not done  FUNCTIONAL GAIT: TBD                                                                                                                             TREATMENT DATE: 02/17/24   Canalith Repositioning:  Epley Right: Comment: 1 rep and no vertigo/nystagmus to f/u testing Gaze Adaptation:   Habituation:   Other:   PATIENT EDUCATION: Education details: assessment details, intervention rationale, f/u recommendations Person educated: Patient Education method: Explanation and Handouts Education comprehension: verbalized understanding  HOME EXERCISE PROGRAM:  GOALS: Goals reviewed with patient? Yes  SHORT TERM GOALS: Target date: same as LTG    LONG TERM GOALS: Target date: 03/30/2024    Patient will be independent in HEP to improve functional outcomes Baseline:  Goal status: INITIAL  2.  Report improved symptoms per score 0 DHI Baseline: 12 Goal status: INITIAL  3.  Symptom free to reduce unsteadiness/risk for falls Baseline: +Right Dix-Hallpike Goal status:  INITIAL   ASSESSMENT:  CLINICAL IMPRESSION: Patient is a 63 y.o. male who was seen today for physical therapy evaluation and treatment for dizziness.  Demonstrates reproduction of symptoms with Right Dix-Hallpike position with right upbeating nystagmus x  12 sec. Initiated treatment with right Epley maneuver tolerating well, and no vertigo/nystagmus to repeat positional testing.  Educated on nature of issue and f/u recommendations. Verbalized understanding.    OBJECTIVE IMPAIRMENTS: decreased balance and dizziness.   ACTIVITY LIMITATIONS: bending, transfers, bed mobility, and locomotion level  PARTICIPATION LIMITATIONS: cleaning and yard work  PERSONAL FACTORS: Age and Time since onset of injury/illness/exacerbation are also affecting patient's functional outcome.   REHAB POTENTIAL: Excellent  CLINICAL DECISION MAKING: Stable/uncomplicated  EVALUATION COMPLEXITY: Low   PLAN:  PT FREQUENCY: 1-2x/week  PT DURATION: 6 weeks  PLANNED INTERVENTIONS: 97750- Physical Performance Testing, 97110-Therapeutic exercises, 97530- Therapeutic activity, [id number]- Neuromuscular re-education, 97535- Self Care, 16109- Manual therapy, [id number]- Gait training, and [phone]- Canalith repositioning  PLAN FOR NEXT SESSION: positional testing as indicated, balance testing if indicated   5:06 PM, 02/17/24 M. Kelly Ascencion Coye, PT, DPT Physical Therapist- [place] Office Number: [phone]

## 2024-02-19 ENCOUNTER — Ambulatory Visit: Admitting: Neurology

## 2024-02-21 ENCOUNTER — Encounter: Payer: Self-pay | Admitting: Diagnostic Neuroimaging

## 2024-02-24 ENCOUNTER — Ambulatory Visit

## 2024-02-24 DIAGNOSIS — R42 Dizziness and giddiness: Secondary | ICD-10-CM

## 2024-02-24 DIAGNOSIS — H8111 Benign paroxysmal vertigo, right ear: Secondary | ICD-10-CM

## 2024-02-24 NOTE — Therapy (Signed)
 OUTPATIENT PHYSICAL THERAPY VESTIBULAR TREATMENT     Patient Name: Jorge Gibson MRN: [id number] DOB:15-Nov-1960, 63 y.o., male Today's Date: 02/24/2024  END OF SESSION:  PT End of Session - 02/24/24 1451     Visit Number 2    Number of Visits 6    Date for PT Re-Evaluation 03/30/24    Authorization Type [employer]na  Preferred    PT Start Time 1450    PT Stop Time 1530    PT Time Calculation (min) 40 min          Past Medical History:  Diagnosis Date   Allergy    tree pollen   Atherosclerosis of aorta (HCC)    CAD (coronary artery disease)    COVID 2020   Elevated coronary artery calcium  score    Epicondylitis elbow, medial    Family history of early CAD    History of smoking    Hyperlipidemia    Hypertension    Pulmonary nodule    Past Surgical History:  Procedure Laterality Date   APPENDECTOMY     COLONOSCOPY  07/09/2022   CORNEAL TRANSPLANT     FINGER FRACTURE SURGERY     Patient Active Problem List   Diagnosis Date Noted   Dyslipidemia 12/07/2022   CAP (community acquired pneumonia) 08/14/2019    PCP: Algernon Antigua, MD REFERRING PROVIDER: Omega Bible, MD  REFERRING DIAG: R42 (ICD-10-CM) - Vertigo R42 (ICD-10-CM) - Orthostatic dizziness  THERAPY DIAG:  No diagnosis found.  ONSET DATE: worse over past year  Rationale for Evaluation and Treatment: Rehabilitation  SUBJECTIVE:   SUBJECTIVE STATEMENT: Doing well, no episodes, able to change position without dizziness Pt accompanied by: self  PERTINENT HISTORY: hx of cornea transplants,   PAIN:  Are you having pain? No  PRECAUTIONS: None  RED FLAGS: None   WEIGHT BEARING RESTRICTIONS: No  FALLS: Has patient fallen in last 6 months? Yes. Number of falls notes a few slips and trips when doing more vigorous activities such as walking in the woods.  LIVING ENVIRONMENT: Lives with: lives with their spouse Lives in: house Stairs: stairs, no issues Has following equipment at  home: None  PLOF: Independent  PATIENT GOALS: eliminate symptoms  OBJECTIVE:   TODAY'S TREATMENT: 02/24/24 Activity Comments  Loaded right Dix-Hallpike No nystagmus, no symptoms  M-CTSIB Condition 1: normal Condition 2: normal Condition 3: normal Condition 4: normal  DHI 0             Note: Objective measures were completed at Evaluation unless otherwise noted.  DIAGNOSTIC FINDINGS: n/a  COGNITION: Overall cognitive status: Within functional limits for tasks assessed   SENSATION: WFL  POSTURE:  No Significant postural limitations  Cervical ROM:    Active A/PROM (deg) eval  Flexion WFL  Extension WFL  Right lateral flexion WFL  Left lateral flexion WFL  Right rotation WFL  Left rotation WFL  (Blank rows = not tested)  STRENGTH:   LOWER EXTREMITY MMT:   MMT Right eval Left eval  Hip flexion    Hip abduction    Hip adduction    Hip internal rotation    Hip external rotation    Knee flexion    Knee extension    Ankle dorsiflexion    Ankle plantarflexion    Ankle inversion    Ankle eversion    (Blank rows = not tested)  VESTIBULAR ASSESSMENT:  GENERAL OBSERVATION:    SYMPTOM BEHAVIOR:  Subjective history: one year worsening positional dizziness  Non-Vestibular symptoms: tinnitus  Type of dizziness: Spinning/Vertigo  Frequency: daily w/ position change  Duration: 10-20 sec  Aggravating factors: Induced by position change: lying supine, rolling to the right, and supine to sit  Relieving factors: slow movements  Progression of symptoms: worse  OCULOMOTOR EXAM:  Ocular Alignment: normal  Ocular ROM: No Limitations  Spontaneous Nystagmus: absent  Gaze-Induced Nystagmus: absent  Smooth Pursuits: intact  Saccades: intact  Convergence/Divergence: 3 cm     VESTIBULAR - OCULAR REFLEX:   Slow VOR: Normal  VOR Cancellation: Normal  Head-Impulse Test: NT  Dynamic Visual Acuity: Not able to be assessed   POSITIONAL TESTING: Right  Dix-Hallpike: upbeating, right nystagmus Left Dix-Hallpike: no nystagmus Right Roll Test: no nystagmus Left Roll Test: no nystagmus  MOTION SENSITIVITY:  Motion Sensitivity Quotient Intensity: 0 = none, 1 = Lightheaded, 2 = Mild, 3 = Moderate, 4 = Severe, 5 = Vomiting  Intensity  1. Sitting to supine   2. Supine to L side   3. Supine to R side   4. Supine to sitting   5. L Hallpike-Dix   6. Up from L    7. R Hallpike-Dix   8. Up from R    9. Sitting, head tipped to L knee   10. Head up from L knee   11. Sitting, head tipped to R knee   12. Head up from R knee   13. Sitting head turns x5   14.Sitting head nods x5   15. In stance, 180 turn to L    16. In stance, 180 turn to R     OTHOSTATICS: not done  FUNCTIONAL GAIT: TBD                                                                                                                             TREATMENT DATE: 02/17/24   Canalith Repositioning:  Epley Right: Comment: 1 rep and no vertigo/nystagmus to f/u testing Gaze Adaptation:   Habituation:   Other:   PATIENT EDUCATION: Education details: assessment details, intervention rationale, f/u recommendations Person educated: Patient Education method: Explanation and Handouts Education comprehension: verbalized understanding  HOME EXERCISE PROGRAM:  GOALS: Goals reviewed with patient? Yes  SHORT TERM GOALS: Target date: same as LTG    LONG TERM GOALS: Target date: 03/30/2024    Patient will be independent in HEP to improve functional outcomes Baseline: n/a at this time Goal status: INITIAL  2.  Report improved symptoms per score 0 DHI Baseline: 12; 0 Goal status: MET  3.  Symptom free to reduce unsteadiness/risk for falls Baseline: +Right Dix-Hallpike; no dizziness/nystagmus Goal status: MET   ASSESSMENT:  CLINICAL IMPRESSION: Reports no episodes of vertigo since last session w/ Epley maneuver. No nystagmus or dizziness to Dix-Hallpike position and  no balance deficits noted.  Discussed recurrence rates for BPPV.  No deficits or limitations at this time. Will hold chart open x 30 days in case symptoms return and treat  accordingly.  OBJECTIVE IMPAIRMENTS: decreased balance and dizziness.   ACTIVITY LIMITATIONS: bending, transfers, bed mobility, and locomotion level  PARTICIPATION LIMITATIONS: cleaning and yard work  PERSONAL FACTORS: Age and Time since onset of injury/illness/exacerbation are also affecting patient's functional outcome.   REHAB POTENTIAL: Excellent  CLINICAL DECISION MAKING: Stable/uncomplicated  EVALUATION COMPLEXITY: Low   PLAN:  PT FREQUENCY: 1-2x/week  PT DURATION: 6 weeks  PLANNED INTERVENTIONS: 97750- Physical Performance Testing, 97110-Therapeutic exercises, 97530- Therapeutic activity, [id number]- Neuromuscular re-education, 97535- Self Care, [id number]- Manual therapy, [id number]- Gait training, and [phone]- Canalith repositioning  PLAN FOR NEXT SESSION: positional testing as indicated, balance testing if indicated   2:52 PM, 02/24/24 M. Kelly Mattelyn Imhoff, PT, DPT Physical Therapist- [place] Office Number: [phone]

## 2024-02-25 ENCOUNTER — Other Ambulatory Visit

## 2024-03-05 ENCOUNTER — Other Ambulatory Visit

## 2024-03-12 ENCOUNTER — Ambulatory Visit
Admission: RE | Admit: 2024-03-12 | Discharge: 2024-03-12 | Disposition: A | Discharge status: Home / Self Care | Source: Ambulatory Visit | Attending: Diagnostic Neuroimaging | Admitting: Diagnostic Neuroimaging

## 2024-03-12 DIAGNOSIS — R42 Dizziness and giddiness: Secondary | ICD-10-CM

## 2024-03-12 MED ORDER — GADOPICLENOL 0.5 MMOL/ML IV SOLN
10.0000 mL | Freq: Once | INTRAVENOUS | Status: AC | PRN
  Administered 2024-03-12: 10 mL via INTRAVENOUS

## 2024-03-17 ENCOUNTER — Ambulatory Visit: Payer: Self-pay | Admitting: Neurology

## 2024-03-17 NOTE — Progress Notes (Signed)
 Kindly inform the patient that MRI scan of the brain shows only mild changes of age-related hardening of the arteries.  No stroke or tumor or anything worrisome is noted.

## 2024-03-17 NOTE — Progress Notes (Signed)
 Kindly inform the patient that MR angiogram study of the blood vessels of the brain mostly shows normal flow.  The vertebral artery on the right side in the back of the brain is of very small caliber this is likely longstanding and not of clinical significance.

## 2024-03-17 NOTE — Progress Notes (Signed)
 Kindly inform the patient that MR angiogram study of the neck blood vessels shows only minor nonsignificant narrowing of both carotid arteries in the neck.  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  Jorge Gibson has good flow in the vertebral artery on the other side and to the back of the brain.

## 2024-04-09 ENCOUNTER — Ambulatory Visit (INDEPENDENT_AMBULATORY_CARE_PROVIDER_SITE_OTHER): Payer: No Typology Code available for payment source

## 2024-04-09 VITALS — BP 171/88 | HR 63 | Temp 98.6°F | Resp 18 | Ht 71.0 in | Wt 234.0 lb

## 2024-04-09 DIAGNOSIS — E785 Hyperlipidemia, unspecified: Secondary | ICD-10-CM

## 2024-04-09 MED ORDER — INCLISIRAN SODIUM 284 MG/1.5ML ~~LOC~~ SOSY
284.0000 mg | PREFILLED_SYRINGE | Freq: Once | SUBCUTANEOUS | Status: AC
Start: 2024-04-09 — End: 2024-04-09
  Administered 2024-04-09: 284 mg via SUBCUTANEOUS
  Filled 2024-04-09: qty 1.5

## 2024-04-09 NOTE — Progress Notes (Signed)
 Diagnosis: Hyperlipidemia  Provider:  Chilton Greathouse MD  Procedure: Injection  Leqvio (inclisiran), Dose: 284 mg, Site: subcutaneous, Number of injections: 1  Injection Site(s): Left arm  Post Care: Patient declined observation  Discharge: Condition: Good, Destination: Home . AVS Declined  Performed by:  Adriana Mccallum, RN

## 2024-05-12 DIAGNOSIS — E785 Hyperlipidemia, unspecified: Secondary | ICD-10-CM

## 2024-05-22 ENCOUNTER — Encounter (HOSPITAL_BASED_OUTPATIENT_CLINIC_OR_DEPARTMENT_OTHER): Payer: Self-pay | Admitting: Internal Medicine

## 2024-09-10 ENCOUNTER — Telehealth: Payer: Self-pay | Admitting: Pharmacy Technician

## 2024-09-10 NOTE — Telephone Encounter (Addendum)
 Dr. Mona / Jenna,  Insurance is requesting updated LDL score for the Leqvio  to be re-approved. Last LDL score/labs was taken in 2024.  Patient will need updated labs. Next scheduled appt 10/12/24.  Thanks Luke Barrows Submission: APPROVED Site of care: Site of care: [facility] Payer: [employer] Medication & CPT/J Code(s) submitted: Leqvio  (Inclisiran) J1306 Diagnosis Code: E78.5 Route of submission (phone, fax, portal): LATENT Phone # Fax # Auth type: Buy/Bill PB Units/visits requested: 2 DOSES Reference number: [id number] Approval from: 09/09/24 to 09/08/25

## 2024-09-11 NOTE — Telephone Encounter (Signed)
 Lab:LIPID   - Collection Date 06/02/2024 05/14/2023 04/23/2022  Collection Time 07:37 AM 07:21 AM 07:38 AM  Order Date 06/02/2024 05/14/2023 04/23/2022  CHOLESTEROL 96    L (Ref Range: 100 - 200 mg/dl) 893 (Ref Range: 899 - 200 mg/dl) 858 (Ref Range: 899 - 200 mg/dl)  TRIGLYCERIDES 863 (Ref Range: 0 - 151 mg/dl) 92 (Ref Range: 30 - 850 mg/dl) 821    H (Ref Range: 30 - 149 mg/dl)  HDL 38    L (Ref Range: 40 - 60 mg/dl) 40 (Ref Range: 40 - 60 mg/dl) 37    L (Ref Range: 40 - 60 mg/dl)  LDL 31 (Ref Range: < mg/dl) 48 (Ref Range: < mg/dl) 68 (Ref Range: < mg/dl)  CHOL/HDL RATIO 2.5 (Ref Range: < RATIO) 2.7 (Ref Range: < RATIO) 3.8 (Ref Range: < RATIO)  LDL/HDL RATIO 0.8    L (Ref Range: 1.7 - 2.5) 1.2    L (Ref Range: 1.7 - 2.5) 1.8 (Ref Range: 1.7 - 2.5)  NON-HDL 58.0 (Ref Range: <) 66.0 (Ref Range: <) 104.0 (Ref Range: <)

## 2024-09-14 ENCOUNTER — Encounter: Payer: Self-pay | Admitting: Internal Medicine

## 2024-10-12 ENCOUNTER — Ambulatory Visit

## 2024-10-14 ENCOUNTER — Ambulatory Visit

## 2024-10-14 VITALS — BP 176/88 | HR 68 | Temp 97.9°F | Resp 16 | Ht 71.0 in | Wt 240.4 lb

## 2024-10-14 DIAGNOSIS — E785 Hyperlipidemia, unspecified: Secondary | ICD-10-CM | POA: Diagnosis not present

## 2024-10-14 MED ORDER — INCLISIRAN SODIUM 284 MG/1.5ML ~~LOC~~ SOSY
284.0000 mg | PREFILLED_SYRINGE | Freq: Once | SUBCUTANEOUS | Status: AC
  Administered 2024-10-14: 284 mg via SUBCUTANEOUS
  Filled 2024-10-14: qty 1.5

## 2024-10-14 NOTE — Progress Notes (Signed)
 Diagnosis: Hyperlipidemia  Provider:  Chilton Greathouse MD  Procedure: Injection  Leqvio (inclisiran), Dose: 284 mg, Site: subcutaneous, Number of injections: 1  Injection Site(s): Left arm  Post Care:  left arm injection  Discharge: Condition: Good, Destination: Home . AVS Provided  Performed by:  Rico Ala, LPN

## 2024-10-28 ENCOUNTER — Ambulatory Visit: Admitting: Internal Medicine

## 2025-04-19 ENCOUNTER — Ambulatory Visit
